# Patient Record
Sex: Female | Born: 1975 | Race: White | Hispanic: No | Marital: Married | Smoking: Never smoker
Health system: Southern US, Community
[De-identification: ages and names within clinical notes are randomized; demographics above are authoritative.]

## PROBLEM LIST (undated history)

## (undated) DIAGNOSIS — F419 Anxiety disorder, unspecified: Secondary | ICD-10-CM

## (undated) DIAGNOSIS — IMO0002 Reserved for concepts with insufficient information to code with codable children: Secondary | ICD-10-CM

## (undated) DIAGNOSIS — S42409A Unspecified fracture of lower end of unspecified humerus, initial encounter for closed fracture: Secondary | ICD-10-CM

## (undated) DIAGNOSIS — E669 Obesity, unspecified: Secondary | ICD-10-CM

## (undated) HISTORY — PX: OTHER SURGICAL HISTORY: SHX169

## (undated) HISTORY — DX: Anxiety disorder, unspecified: F41.9

## (undated) HISTORY — DX: Reserved for concepts with insufficient information to code with codable children: IMO0002

## (undated) HISTORY — DX: Unspecified fracture of lower end of unspecified humerus, initial encounter for closed fracture: S42.409A

## (undated) HISTORY — DX: Obesity, unspecified: E66.9

## (undated) HISTORY — PX: EYE SURGERY: SHX253

---

## 2012-08-14 ENCOUNTER — Encounter: Payer: Self-pay | Admitting: Emergency Medicine

## 2012-08-14 ENCOUNTER — Emergency Department (INDEPENDENT_AMBULATORY_CARE_PROVIDER_SITE_OTHER)
Admission: EM | Admit: 2012-08-14 | Discharge: 2012-08-14 | Disposition: A | Discharge status: Home / Self Care | Payer: BC Managed Care – PPO | Source: Home / Self Care | Attending: Family Medicine | Admitting: Family Medicine

## 2012-08-14 DIAGNOSIS — Z711 Person with feared health complaint in whom no diagnosis is made: Secondary | ICD-10-CM

## 2012-08-14 NOTE — ED Notes (Signed)
STD testing, Patient is asymptomatic, but may have had possible exposure due to husband's affair

## 2012-08-14 NOTE — ED Provider Notes (Signed)
History     CSN: [id number]  Arrival date & time 08/14/12  0905   First MD Initiated Contact with Patient 08/14/12 1013      Chief Complaint  Patient presents with  . std testing       HPI Comments: Patient presents for STD testing.  She is completely asymptomatic, but fears that she may have had possible exposure due to her husband's affair.  The history is provided by the patient.    History reviewed. No pertinent past medical history.  Past Surgical History  Procedure Date  . Tibial plateau repair     Family History  Problem Relation Age of Onset  . Alzheimer's disease Father     History  Substance Use Topics  . Smoking status: Never Smoker   . Smokeless tobacco: Not on file  . Alcohol Use: Yes    OB History    Grav Para Term Preterm Abortions TAB SAB Ect Mult Living                  Review of Systems  Constitutional: Negative.   Genitourinary: Negative.   All other systems reviewed and are negative.    Allergies  Review of patient's allergies indicates no known allergies.  Home Medications   Current Outpatient Rx  Name  Route  Sig  Dispense  Refill  . ALPRAZOLAM 0.25 MG PO TABS   Oral   Take 0.25 mg by mouth at bedtime as needed.         Marland Kitchen ESCITALOPRAM OXALATE 10 MG PO TABS   Oral   Take 10 mg by mouth daily.           BP 100/67  Pulse 78  Temp 97.6 F (36.4 C) (Oral)  Resp 16  Ht 5\' 6"  (1.676 m)  Wt 196 lb (88.905 kg)  BMI 31.64 kg/m2  SpO2 98%  Physical Exam Nursing notes and Vital Signs reviewed. Appearance:  Patient appears stated age, and in no acute distress. Patient is obese (BMI 31.6) Eyes:  Pupils are equal, round, and reactive to light and accomodation.  Extraocular movement is intact.  Conjunctivae are not inflamed  Pharynx:  Normal Neck:  Supple.  No adenopathy Lungs:  Clear to auscultation.  Breath sounds are equal.  Heart:  Regular rate and rhythm without murmurs, rubs, or gallops.  Abdomen:  Nontender without  masses or hepatosplenomegaly.  Bowel sounds are present.  No CVA or flank tenderness.  Extremities:  No edema.  No calf tenderness Skin:  No rash present.  Pelvic exam deferred  ED Course  Procedures none   Labs Reviewed  RPR   Narrative:    Performed at:  [facility]                [address], [address]                [place], [place] [address]  HSV(HERPES SMPLX)ABS-I+II(IGG+IGM)-BLD   Narrative:    Performed at:  [facility]                [address], [address]                [place], [place] [address]  HIV ANTIBODY (ROUTINE TESTING)   Narrative:    Performed at:  [facility]                [address]  [place], [place] [address]  GC/CHLAMYDIA PROBE AMP, URINE   Narrative:    Performed at:  [facility]                [address]                [place], [place] [address]      1. Concern about STD in female without diagnosis       MDM  Pending GC/chlamydia, RPR, HSV 1 & 2, HIV.        Lattie Haw, MD 08/18/12 2012

## 2012-08-15 LAB — HIV ANTIBODY (ROUTINE TESTING W REFLEX): HIV: NONREACTIVE

## 2012-08-15 LAB — RPR

## 2012-08-21 ENCOUNTER — Telehealth: Payer: Self-pay | Admitting: *Deleted

## 2012-11-06 ENCOUNTER — Ambulatory Visit (INDEPENDENT_AMBULATORY_CARE_PROVIDER_SITE_OTHER): Payer: BC Managed Care – PPO

## 2012-11-06 ENCOUNTER — Encounter: Payer: Self-pay | Admitting: Physician Assistant

## 2012-11-06 ENCOUNTER — Ambulatory Visit (INDEPENDENT_AMBULATORY_CARE_PROVIDER_SITE_OTHER): Payer: BC Managed Care – PPO | Admitting: Physician Assistant

## 2012-11-06 VITALS — BP 105/78 | HR 69 | Ht 67.0 in

## 2012-11-06 DIAGNOSIS — F341 Dysthymic disorder: Secondary | ICD-10-CM

## 2012-11-06 DIAGNOSIS — F4323 Adjustment disorder with mixed anxiety and depressed mood: Secondary | ICD-10-CM

## 2012-11-06 DIAGNOSIS — M238X2 Other internal derangements of left knee: Secondary | ICD-10-CM

## 2012-11-06 DIAGNOSIS — S82202A Unspecified fracture of shaft of left tibia, initial encounter for closed fracture: Secondary | ICD-10-CM | POA: Insufficient documentation

## 2012-11-06 DIAGNOSIS — Z9889 Other specified postprocedural states: Secondary | ICD-10-CM

## 2012-11-06 DIAGNOSIS — R29898 Other symptoms and signs involving the musculoskeletal system: Secondary | ICD-10-CM

## 2012-11-06 DIAGNOSIS — S8992XA Unspecified injury of left lower leg, initial encounter: Secondary | ICD-10-CM

## 2012-11-06 DIAGNOSIS — F329 Major depressive disorder, single episode, unspecified: Secondary | ICD-10-CM

## 2012-11-06 DIAGNOSIS — F419 Anxiety disorder, unspecified: Secondary | ICD-10-CM

## 2012-11-06 DIAGNOSIS — W19XXXA Unspecified fall, initial encounter: Secondary | ICD-10-CM

## 2012-11-06 DIAGNOSIS — S99929A Unspecified injury of unspecified foot, initial encounter: Secondary | ICD-10-CM

## 2012-11-06 MED ORDER — VENLAFAXINE HCL ER 37.5 MG PO CP24: 37.5000 mg | ORAL_CAPSULE | Freq: Every day | ORAL | Status: DC

## 2012-11-06 MED ORDER — CLONAZEPAM 0.5 MG PO TABS: 0.5000 mg | ORAL_TABLET | Freq: Two times a day (BID) | ORAL | Status: DC | PRN

## 2012-11-06 NOTE — Progress Notes (Signed)
  Subjective:    Patient ID: Patricia Robertson, female    DOB: 1975/10/05, 37 y.o.   MRN: [id number]  HPI Patient is a 37 yo female who presents to the clinic to establish care. PMH is positive for a fracture to her tibal plateau, anxiety and depression. Family hx positive for diabetes/alzheimers. Never smoked. Social drinker.   Anxiety/Depression- uncontrolled pt has been on lexapro for 5 years and has worked well. Pt going through divorce and moved to [place] back from [place]. She is having occasional panic attacks where she does not know what to do. She feels very out of control with her finances/marrriage/her daughters happiness. She denies any feelings of suicide or harming others. She does not like how xanax makes her feel. She wants something that does not knock her out. Has appt with priest but things have been busy with easter.   She is also concerned because about 1 week ago she reinjured her left knee and felt a crackling piece of something in her knee cap. She had reconstructive surgery in 2012 when her left tibal plataeu fractured. She does not feel the loose object today. There is no ongoing pain. Wants checked out today.     Review of Systems  All other systems reviewed and are negative.       Objective:   Physical Exam  Constitutional: She is oriented to person, place, and time. She appears well-developed and well-nourished.  HENT:  Head: Normocephalic and atraumatic.  Cardiovascular: Normal rate, regular rhythm and normal heart sounds.   Pulmonary/Chest: Effort normal and breath sounds normal. She has no wheezes.  Musculoskeletal:  Well -healed scar on the lateral and medial side of left knee. 2+ creptius. Strength 5/5. No joint tenderness. No extra bone or object felt in knee cap. ROM normal.   Neurological: She is alert and oriented to person, place, and time.  Skin: Skin is warm and dry.  Psychiatric: She has a normal mood and affect. Her behavior is normal.           Assessment & Plan:  Anxiety and depression- will taper off lexapro and start effexor. Pt aware of adverse side effects. Will recheck in 6 weeks. Stop xanax and start klonapin as needed. I think unbaised counselor would help with coping process. Pt would like faith based counseling and is working on that through her church.   Left knee creptius/new trauma- Will xray today. Reassured pt that I did not feel anything in knee cap. Will call with results.

## 2012-11-07 ENCOUNTER — Telehealth: Payer: Self-pay | Admitting: *Deleted

## 2012-11-07 NOTE — Telephone Encounter (Signed)
Pt calls and was seen yesterday and thought you were gonna send in Celebrex for her as you had discussed this but she did not the rx for it.

## 2012-11-07 NOTE — Telephone Encounter (Signed)
Opened in error

## 2012-11-07 NOTE — Telephone Encounter (Signed)
I do not remember that. If that is what she has been on I can continue. I am guessing this is for knee pain? I hope she did get effexor? I did send that. Confirm celebrex for knee pain and if that is what she has been on will send to pharmacy.

## 2012-11-08 ENCOUNTER — Other Ambulatory Visit: Payer: Self-pay | Admitting: Physician Assistant

## 2012-11-08 MED ORDER — CELECOXIB 100 MG PO CAPS: 100.0000 mg | ORAL_CAPSULE | Freq: Two times a day (BID) | ORAL | Status: DC

## 2012-11-08 NOTE — Progress Notes (Signed)
Sent!

## 2012-11-08 NOTE — Telephone Encounter (Signed)
Yes it is for the knee pain. Please send to pharmacy

## 2012-12-15 ENCOUNTER — Ambulatory Visit: Payer: BC Managed Care – PPO | Admitting: Physician Assistant

## 2012-12-20 ENCOUNTER — Encounter: Payer: Self-pay | Admitting: Physician Assistant

## 2012-12-20 ENCOUNTER — Ambulatory Visit (INDEPENDENT_AMBULATORY_CARE_PROVIDER_SITE_OTHER): Payer: BC Managed Care – PPO | Admitting: Physician Assistant

## 2012-12-20 VITALS — BP 126/76 | HR 79 | Wt 199.0 lb

## 2012-12-20 DIAGNOSIS — F419 Anxiety disorder, unspecified: Secondary | ICD-10-CM

## 2012-12-20 DIAGNOSIS — F341 Dysthymic disorder: Secondary | ICD-10-CM

## 2012-12-20 MED ORDER — VENLAFAXINE HCL ER 37.5 MG PO CP24: 37.5000 mg | ORAL_CAPSULE | Freq: Every day | ORAL | Status: DC

## 2012-12-20 MED ORDER — CETIRIZINE HCL 10 MG PO TABS: 10.0000 mg | ORAL_TABLET | Freq: Every day | ORAL | Status: DC

## 2012-12-20 NOTE — Progress Notes (Signed)
  Subjective:    Patient ID: Patricia Robertson, female    DOB: [birth year], 36 y.o.   MRN: [id number]  HPI Patient presents to the clinic for medication refill for Effexor and klonapin. Pt reports that she is feeling great. She is 100 percent improved with anxiety and depression. She feels like she has her head on straight and able to accomplish things. She denies any feelings of suicide or helplessness. She likes the current dose and does not want to make any changes. She occasionally uses klonapin but not everyday. It does help when she needs it the most. She takes effexor everyday.    Review of Systems     Objective:   Physical Exam  Constitutional: She appears well-developed and well-nourished.  HENT:  Head: Normocephalic and atraumatic.  Cardiovascular: Normal rate, regular rhythm and normal heart sounds.   Pulmonary/Chest: Effort normal and breath sounds normal. She has no wheezes.  Skin: Skin is warm and dry.  Psychiatric: She has a normal mood and affect. Her behavior is normal.          Assessment & Plan:  Anxiety and depression- Refilled Effexor. Does not need refill on klonapin. Follow up in 6 months.   Seasonal allergies- refilled zyrtec.   Pt aware need CPE.

## 2012-12-31 ENCOUNTER — Other Ambulatory Visit: Payer: Self-pay | Admitting: Physician Assistant

## 2013-03-09 ENCOUNTER — Encounter: Payer: Self-pay | Admitting: Physician Assistant

## 2013-03-09 ENCOUNTER — Ambulatory Visit (INDEPENDENT_AMBULATORY_CARE_PROVIDER_SITE_OTHER): Payer: BC Managed Care – PPO | Admitting: Physician Assistant

## 2013-03-09 VITALS — BP 129/71 | HR 90 | Ht 66.0 in | Wt 199.0 lb

## 2013-03-09 DIAGNOSIS — Z23 Encounter for immunization: Secondary | ICD-10-CM

## 2013-03-09 DIAGNOSIS — Z0000 Encounter for general adult medical examination without abnormal findings: Secondary | ICD-10-CM

## 2013-03-09 DIAGNOSIS — Z13220 Encounter for screening for lipoid disorders: Secondary | ICD-10-CM

## 2013-03-09 DIAGNOSIS — Z131 Encounter for screening for diabetes mellitus: Secondary | ICD-10-CM

## 2013-03-09 NOTE — Progress Notes (Signed)
  Subjective:     Patricia Robertson is a 37 y.o. female and is here for a comprehensive physical exam. The patient reports no problems. Patient would like to discuss birth control options.  History   Social History  . Marital Status: Legally Separated    Spouse Name: N/A    Number of Children: N/A  . Years of Education: N/A   Occupational History  . Not on file.   Social History Main Topics  . Smoking status: Never Smoker   . Smokeless tobacco: Not on file  . Alcohol Use: Yes  . Drug Use: Not on file  . Sexual Activity: Not on file   Other Topics Concern  . Not on file   Social History Narrative  . No narrative on file   Health Maintenance  Topic Date Due  . Tetanus/tdap  11/27/1994  . Influenza Vaccine  03/19/2013  . Pap Smear  03/10/2015    The following portions of the patient's history were reviewed and updated as appropriate: allergies, current medications, past family history, past medical history, past social history, past surgical history and problem list.  Review of Systems A comprehensive review of systems was negative.   Objective:    BP 129/71  Pulse 90  Ht 5\' 6"  (1.676 m)  Wt 199 lb (90.266 kg)  BMI 32.13 kg/m2  SpO2 97% General appearance: alert, cooperative and appears stated age Head: Normocephalic, without obvious abnormality, atraumatic Eyes: conjunctivae/corneas clear. PERRL, EOM's intact. Fundi benign. Ears: normal TM's and external ear canals both ears Nose: Nares normal. Septum midline. Mucosa normal. No drainage or sinus tenderness. Throat: lips, mucosa, and tongue normal; teeth and gums normal Neck: no adenopathy, no carotid bruit, no JVD, supple, symmetrical, trachea midline and thyroid not enlarged, symmetric, no tenderness/mass/nodules Back: symmetric, no curvature. ROM normal. No CVA tenderness. Lungs: clear to auscultation bilaterally Breasts: normal appearance, no masses or tenderness Heart: regular rate and rhythm, S1, S2 normal,  no murmur, click, rub or gallop Abdomen: soft, non-tender; bowel sounds normal; no masses,  no organomegaly Extremities: extremities normal, atraumatic, no cyanosis or edema Pulses: 2+ and symmetric Skin: Skin color, texture, turgor normal. No rashes or lesions Lymph nodes: Cervical, supraclavicular, and axillary nodes normal. Neurologic: Grossly normal    Assessment:    Healthy female exam.      Plan:     CPE-last Pap was 03/09/12. Patient was given Tdap today. All other vaccines up-to-date. Fasting cholesterol and CMP were ordered today. Handout was given on health maintenance items. Regular diet and exercise was discussed patient encouraged to take calcium 1200 mg and vitamin D 800 mg daily. Birth control options were discussed in detail NuvaRing and IUD are her favorite. We did not have a sample of the neighboring they gave informational packet on each choice. Patient is to call back if she would like referral for IUD insertion or call in [place]. Patient aware that this does not protect against STDs.  Anxiety and depression-patient is well controlled today she does not need a refill.  After Visit Summary for Counseling Recommendations

## 2013-03-09 NOTE — Patient Instructions (Addendum)
Intrauterine Device Information An intrauterine device (IUD) is inserted into your uterus and prevents pregnancy. There are 2 types of IUDs available:  Copper IUD. This type of IUD is wrapped in copper wire and is placed inside the uterus. Copper makes the uterus and fallopian tubes produce a fluid that kills sperm. The copper IUD can stay in place for 10 years.  Hormone IUD. This type of IUD contains the hormone progestin (synthetic progesterone). The hormone thickens the cervical mucus and prevents sperm from entering the uterus, and it also thins the uterine lining to prevent implantation of a fertilized egg. The hormone can weaken or kill the sperm that get into the uterus. The hormone IUD can stay in place for 5 years. Your caregiver will make sure you are a good candidate for a contraceptive IUD. Discuss with your caregiver the possible side effects. ADVANTAGES  It is highly effective, reversible, long-acting, and low maintenance.  There are no estrogen-related side effects.  An IUD can be used when breastfeeding.  It is not associated with weight gain.  It works immediately after insertion.  The copper IUD does not interfere with your female hormones.  The progesterone IUD can make heavy menstrual periods lighter.  The progesterone IUD can be used for 5 years.  The copper IUD can be used for 10 years. DISADVANTAGES  The progesterone IUD can be associated with irregular bleeding patterns.  The copper IUD can make your menstrual flow heavier and more painful.  You may experience cramping and vaginal bleeding after insertion. Document Released: 06/08/2004 Document Revised: 09/27/2011 Document Reviewed: 11/07/2010 [facility] Patient Information 2014 [place], [place]. Keeping You Healthy  Get These Tests 1. Blood Pressure- Have your blood pressure checked once a year by your health care provider.  Normal blood pressure is 120/80. 2. Weight- Have your body mass index (BMI)  calculated to screen for obesity.  BMI is measure of body fat based on height and weight.  You can also calculate your own BMI at https://www.west-esparza.com/. 3. Cholesterol- Have your cholesterol checked every 5 years starting at age 66 then yearly starting at age 56. 4. Chlamydia, HIV, and other sexually transmitted diseases- Get screened every year until age 51, then within three months of each new sexual provider. 5. Pap Smear- Every 1-3 years; discuss with your health care provider. 6. Mammogram- Every year starting at age 40  Take these medicines  Calcium with Vitamin D-Your body needs 1200 mg of Calcium each day and [phone] IU of Vitamin D daily.  Your body can only absorb 500 mg of Calcium at a time so Calcium must be taken in 2 or 3 divided doses throughout the day.  Multivitamin with folic acid- Once daily if it is possible for you to become pregnant.  Get these Immunizations  Gardasil-Series of three doses; prevents HPV related illness such as genital warts and cervical cancer.  Menactra-Single dose; prevents meningitis.  Tetanus shot- Every 10 years.  Flu shot-Every year.  Take these steps 1. Do not smoke-Your healthcare provider can help you quit.  For tips on how to quit go to www.smokefree.gov or call 1-800 QUITNOW. 2. Be physically active- Exercise 5 days a week for at least 30 minutes.  If you are not already physically active, start slow and gradually work up to 30 minutes of moderate physical activity.  Examples of moderate activity include walking briskly, dancing, swimming, bicycling, etc. 3. Breast Cancer- A self breast exam every month is important for early detection of breast  cancer.  For more information and instruction on self breast exams, ask your healthcare provider or [place].es. 4. Eat a healthy diet- Eat a variety of healthy foods such as fruits, vegetables, whole grains, low fat milk, low fat cheeses, yogurt, lean meats,  poultry and fish, beans, nuts, tofu, etc.  For more information go to www. Thenutritionsource.org 5. Drink alcohol in moderation- Limit alcohol intake to one drink or less per day. Never drink and drive. 6. Depression- Your emotional health is as important as your physical health.  If you're feeling down or losing interest in things you normally enjoy please talk to your healthcare provider about being screened for depression. 7. Dental visit- Brush and floss your teeth twice daily; visit your dentist twice a year. 8. Eye doctor- Get an eye exam at least every 2 years. 9. Helmet use- Always wear a helmet when riding a bicycle, motorcycle, rollerblading or skateboarding. 10. Safe sex- If you may be exposed to sexually transmitted infections, use a condom. 11. Seat belts- Seat belts can save your live; always wear one. 12. Smoke/Carbon Monoxide detectors- These detectors need to be installed on the appropriate level of your home. Replace batteries at least once a year. 13. Skin cancer- When out in the sun please cover up and use sunscreen 15 SPF or higher. 14. Violence- If anyone is threatening or hurting you, please tell your healthcare provider.

## 2013-05-04 ENCOUNTER — Telehealth: Payer: Self-pay | Admitting: Physician Assistant

## 2013-05-04 MED ORDER — AMBULATORY NON FORMULARY MEDICATION: Status: DC

## 2013-05-04 NOTE — Telephone Encounter (Signed)
Pt comes into office with daughter and reports headaches in neck and back of head. She is under a lot of stress. Decided to give rx for monthly massage. Continue to take aleve as needed for HA. She does not take celebrex daily only PRN.

## 2013-05-08 ENCOUNTER — Other Ambulatory Visit: Payer: Self-pay | Admitting: Physician Assistant

## 2013-05-10 ENCOUNTER — Telehealth: Payer: Self-pay | Admitting: *Deleted

## 2013-05-10 NOTE — Telephone Encounter (Signed)
If the symptoms just started I do not think effexor. You have been on for many months now. Sounds like a virus. If worsening or countinuing in the next week come in for visit.

## 2013-05-10 NOTE — Telephone Encounter (Signed)
Pt states that she is experiencing HA, muscle aches & diarrhea since starting the effexxor.  Could these be side effects or could this be the stress that she's under? Please advise

## 2013-05-11 NOTE — Telephone Encounter (Signed)
LMOM notifying pt. 

## 2013-05-25 ENCOUNTER — Encounter: Payer: Self-pay | Admitting: Family Medicine

## 2013-05-25 ENCOUNTER — Telehealth: Payer: Self-pay | Admitting: *Deleted

## 2013-05-25 ENCOUNTER — Ambulatory Visit (INDEPENDENT_AMBULATORY_CARE_PROVIDER_SITE_OTHER): Payer: BC Managed Care – PPO | Admitting: Family Medicine

## 2013-05-25 VITALS — BP 115/88 | HR 64 | Temp 97.4°F | Wt 197.0 lb

## 2013-05-25 DIAGNOSIS — G43901 Migraine, unspecified, not intractable, with status migrainosus: Secondary | ICD-10-CM

## 2013-05-25 DIAGNOSIS — M62838 Other muscle spasm: Secondary | ICD-10-CM

## 2013-05-25 DIAGNOSIS — G43909 Migraine, unspecified, not intractable, without status migrainosus: Secondary | ICD-10-CM

## 2013-05-25 MED ORDER — CYCLOBENZAPRINE HCL 10 MG PO TABS: 5.0000 mg | ORAL_TABLET | Freq: Every evening | ORAL | Status: DC | PRN

## 2013-05-25 MED ORDER — SUMATRIPTAN SUCCINATE 100 MG PO TABS: 100.0000 mg | ORAL_TABLET | ORAL | Status: DC | PRN

## 2013-05-25 MED ORDER — METHYLPREDNISOLONE SODIUM SUCC 125 MG IJ SOLR
125.0000 mg | Freq: Once | INTRAMUSCULAR | Status: AC
  Administered 2013-05-25: 125 mg via INTRAMUSCULAR

## 2013-05-25 MED ORDER — KETOROLAC TROMETHAMINE 60 MG/2ML IM SOLN
60.0000 mg | Freq: Once | INTRAMUSCULAR | Status: AC
  Administered 2013-05-25: 60 mg via INTRAMUSCULAR

## 2013-05-25 NOTE — Telephone Encounter (Signed)
Pt left message stating that she went home from work yesterday with a HA & still has it today.  She called a couple weeks ago with the same complaint.  Patricia Robertson seemed to think the HA was going along with other viral sx but pt is still complaining.  Needs appt? Please advise

## 2013-05-25 NOTE — Progress Notes (Signed)
Subjective:    Patient ID: Patricia Robertson, female    DOB: 1975-12-04, 37 y.o.   MRN: [id number]  HPI Has been having HA since mid- October.  Fist HA was 10/10 and lasted 3 days. Then about 2 weeks ago had another HA and lasted 2 days.  This HA started on 3 days ago and has been a little today . At the worse feels like on the top of the head and is bilaterall. Lots os pressue behind her eyes. Feels better to put pressure on her eye.  Having a lot of pain and pressure at the occiput.  Lots of shoulder and neck tension.  Has been nauseted as well. Has had loose bowels as well.  No vomiting.  Took celebrex - no relief.  Aleve - helped some.  Tried goodies and Advil too -helps some but keeps coming back. No vision changes.  Has not tried heat or ice on the neck.     Review of Systems BP 115/88  Pulse 64  Temp(Src) 97.4 F (36.3 C)  Wt 197 lb (89.359 kg)    No Known Allergies  No past medical history on file.  Past Surgical History  Procedure Laterality Date  . Tibial plateau repair      History   Social History  . Marital Status: Legally Separated    Spouse Name: N/A    Number of Children: N/A  . Years of Education: N/A   Occupational History  . Not on file.   Social History Main Topics  . Smoking status: Never Smoker   . Smokeless tobacco: Not on file  . Alcohol Use: Yes  . Drug Use: Not on file  . Sexual Activity: Not on file   Other Topics Concern  . Not on file   Social History Narrative  . No narrative on file    Family History  Problem Relation Age of Onset  . Alzheimer's disease Father   . Diabetes Mother   . Heart attack Maternal Grandfather   . Diabetes Maternal Grandfather     Outpatient Encounter Prescriptions as of 05/25/2013  Medication Sig  . celecoxib (CELEBREX) 100 MG capsule Take 100 mg by mouth as needed.  . [DISCONTINUED] celecoxib (CELEBREX) 100 MG capsule Take 1 capsule (100 mg total) by mouth 2 (two) times daily.  . AMBULATORY NON FORMULARY  MEDICATION Monthly therapeutic massage for tension headaches.  . cetirizine (ZYRTEC) 10 MG tablet Take 1 tablet (10 mg total) by mouth daily.  . clonazePAM (KLONOPIN) 0.5 MG tablet TAKE 1 TABLET BY MOUTH TWICE A DAY AS NEEDED FOR ANXIETY  . cyclobenzaprine (FLEXERIL) 10 MG tablet Take 0.5-1 tablets (5-10 mg total) by mouth at bedtime as needed for muscle spasms.  . SUMAtriptan (IMITREX) 100 MG tablet Take 1 tablet (100 mg total) by mouth every 2 (two) hours as needed for migraine or headache. May repeat in 2 hours if headache persists or recurs.  . venlafaxine XR (EFFEXOR-XR) 37.5 MG 24 hr capsule Take 1 capsule (37.5 mg total) by mouth daily.  . [EXPIRED] ketorolac (TORADOL) injection 60 mg   . [EXPIRED] methylPREDNISolone sodium succinate (SOLU-MEDROL) 125 mg/2 mL injection 125 mg           Objective:   Physical Exam  Constitutional: She is oriented to person, place, and time. She appears well-developed and well-nourished.  HENT:  Head: Normocephalic and atraumatic.  Right Ear: External ear normal.  Left Ear: External ear normal.  Nose: Nose normal.  Mouth/Throat: Oropharynx is  clear and moist.  TMs and canals are clear.   Eyes: Conjunctivae and EOM are normal. Pupils are equal, round, and reactive to light.  Neck: Neck supple. No thyromegaly present.  Cardiovascular: Normal rate, regular rhythm and normal heart sounds.   Pulmonary/Chest: Effort normal and breath sounds normal. She has no wheezes.  Lymphadenopathy:    She has no cervical adenopathy.  Neurological: She is alert and oriented to person, place, and time. She has normal reflexes. No cranial nerve deficit. She exhibits normal muscle tone.  Skin: Skin is warm and dry.  Psychiatric: She has a normal mood and affect. Her behavior is normal.          Assessment & Plan:  Migrainous, status - Tx with toradol and solumedrol IM. Go home and rest.  Work on neck tension with stretches and heating pad.  Will add muscle  relaxer at bedtime prn.  Monitor for triggers like foods etc.  Refer to PT for neck and shoulder tension.  F/U in 1 mo wth [name].  Given rx for miitex to use for rescue as well.   Neck and shoulder tension- refer for PT.  Says her insurance won't cover massage therapy.   She asked for 90 day rx for her clonazepam but she is not for refill yet.

## 2013-05-25 NOTE — Telephone Encounter (Signed)
Needs appt

## 2013-05-25 NOTE — Telephone Encounter (Signed)
Pt notified & was transferred to scheduling. 

## 2013-06-11 ENCOUNTER — Ambulatory Visit: Payer: BC Managed Care – PPO | Admitting: Physical Therapy

## 2013-06-11 DIAGNOSIS — M542 Cervicalgia: Secondary | ICD-10-CM

## 2013-06-11 DIAGNOSIS — G43901 Migraine, unspecified, not intractable, with status migrainosus: Secondary | ICD-10-CM

## 2013-06-11 DIAGNOSIS — M62838 Other muscle spasm: Secondary | ICD-10-CM

## 2013-06-11 DIAGNOSIS — M2560 Stiffness of unspecified joint, not elsewhere classified: Secondary | ICD-10-CM

## 2013-06-12 ENCOUNTER — Other Ambulatory Visit: Payer: Self-pay | Admitting: Family Medicine

## 2013-06-18 ENCOUNTER — Encounter: Payer: BC Managed Care – PPO | Admitting: Physical Therapy

## 2013-06-18 DIAGNOSIS — M2560 Stiffness of unspecified joint, not elsewhere classified: Secondary | ICD-10-CM

## 2013-06-18 DIAGNOSIS — G43901 Migraine, unspecified, not intractable, with status migrainosus: Secondary | ICD-10-CM

## 2013-06-18 DIAGNOSIS — M542 Cervicalgia: Secondary | ICD-10-CM

## 2013-06-18 DIAGNOSIS — M62838 Other muscle spasm: Secondary | ICD-10-CM

## 2013-06-19 ENCOUNTER — Encounter: Payer: BC Managed Care – PPO | Admitting: Physical Therapy

## 2013-06-22 ENCOUNTER — Encounter: Payer: BC Managed Care – PPO | Admitting: Physical Therapy

## 2013-06-22 ENCOUNTER — Ambulatory Visit (INDEPENDENT_AMBULATORY_CARE_PROVIDER_SITE_OTHER): Payer: BC Managed Care – PPO | Admitting: Physician Assistant

## 2013-06-22 ENCOUNTER — Ambulatory Visit: Payer: BC Managed Care – PPO | Admitting: Family Medicine

## 2013-06-22 ENCOUNTER — Encounter: Payer: Self-pay | Admitting: Physician Assistant

## 2013-06-22 VITALS — BP 117/75 | HR 62 | Wt 195.0 lb

## 2013-06-22 DIAGNOSIS — M62838 Other muscle spasm: Secondary | ICD-10-CM

## 2013-06-22 DIAGNOSIS — M542 Cervicalgia: Secondary | ICD-10-CM

## 2013-06-22 DIAGNOSIS — G43901 Migraine, unspecified, not intractable, with status migrainosus: Secondary | ICD-10-CM

## 2013-06-22 DIAGNOSIS — G43909 Migraine, unspecified, not intractable, without status migrainosus: Secondary | ICD-10-CM

## 2013-06-22 DIAGNOSIS — F341 Dysthymic disorder: Secondary | ICD-10-CM

## 2013-06-22 DIAGNOSIS — M2560 Stiffness of unspecified joint, not elsewhere classified: Secondary | ICD-10-CM

## 2013-06-22 DIAGNOSIS — F329 Major depressive disorder, single episode, unspecified: Secondary | ICD-10-CM

## 2013-06-22 MED ORDER — CLONAZEPAM 0.5 MG PO TABS: 0.5000 mg | ORAL_TABLET | Freq: Two times a day (BID) | ORAL | Status: DC | PRN

## 2013-06-22 MED ORDER — SUMATRIPTAN SUCCINATE 100 MG PO TABS: 100.0000 mg | ORAL_TABLET | ORAL | Status: DC | PRN

## 2013-06-22 MED ORDER — CYCLOBENZAPRINE HCL 10 MG PO TABS: 10.0000 mg | ORAL_TABLET | Freq: Every day | ORAL | Status: DC

## 2013-06-22 MED ORDER — VENLAFAXINE HCL ER 37.5 MG PO CP24: 37.5000 mg | ORAL_CAPSULE | Freq: Every day | ORAL | Status: DC

## 2013-06-23 NOTE — Progress Notes (Signed)
   Subjective:    Patient ID: Patricia Robertson, female    DOB: 07-Apr-1976, 37 y.o.   MRN: [id number]  HPI Patient returns to clinic for medication refill for migraines/neck muscle spasms and anxiety/depression.   Migraines have been very much so controlled going to PT, using flexeril at bedtime and taking imitrex as needed for rescue. She has only had 2-3 headaches since and they have been easily treated.   Anxiety and depression is very much so controlled. Pt feels great. She would even like to consider coming off in the near future. It helped with the transition from divorce but she feels much more stable now.    Review of Systems     Objective:   Physical Exam  Constitutional: She is oriented to person, place, and time. She appears well-developed and well-nourished.  HENT:  Head: Normocephalic and atraumatic.  Cardiovascular: Normal rate, regular rhythm and normal heart sounds.   Pulmonary/Chest: Effort normal and breath sounds normal.  Neurological: She is alert and oriented to person, place, and time.  Skin: Skin is warm and dry.  Psychiatric: She has a normal mood and affect. Her behavior is normal.          Assessment & Plan:  Migraine/neck muscle spasms- Pt is doing much better. Encouraged pt to continue with PT. Refilled Imitrex if needed for relief. Refilled flexeril to continue taking at bedtime if helping prevent migraines. Follow up if needed or in 6 months.   Anxiety/depression- Refilled effexor for 6 months. We can discuss taper at that time if pt is still wanting too.

## 2013-06-26 ENCOUNTER — Encounter: Payer: BC Managed Care – PPO | Admitting: Physical Therapy

## 2013-06-26 DIAGNOSIS — M62838 Other muscle spasm: Secondary | ICD-10-CM

## 2013-06-26 DIAGNOSIS — M2560 Stiffness of unspecified joint, not elsewhere classified: Secondary | ICD-10-CM

## 2013-06-26 DIAGNOSIS — G43901 Migraine, unspecified, not intractable, with status migrainosus: Secondary | ICD-10-CM

## 2013-06-26 DIAGNOSIS — M542 Cervicalgia: Secondary | ICD-10-CM

## 2013-07-02 ENCOUNTER — Encounter (INDEPENDENT_AMBULATORY_CARE_PROVIDER_SITE_OTHER): Payer: BC Managed Care – PPO | Admitting: Physical Therapy

## 2013-07-02 DIAGNOSIS — M62838 Other muscle spasm: Secondary | ICD-10-CM

## 2013-07-02 DIAGNOSIS — M542 Cervicalgia: Secondary | ICD-10-CM

## 2013-07-02 DIAGNOSIS — G43901 Migraine, unspecified, not intractable, with status migrainosus: Secondary | ICD-10-CM

## 2013-07-02 DIAGNOSIS — M2560 Stiffness of unspecified joint, not elsewhere classified: Secondary | ICD-10-CM

## 2013-07-05 ENCOUNTER — Encounter: Payer: BC Managed Care – PPO | Admitting: Physical Therapy

## 2013-07-05 ENCOUNTER — Encounter (INDEPENDENT_AMBULATORY_CARE_PROVIDER_SITE_OTHER): Payer: BC Managed Care – PPO | Admitting: Physical Therapy

## 2013-07-05 DIAGNOSIS — M2560 Stiffness of unspecified joint, not elsewhere classified: Secondary | ICD-10-CM

## 2013-07-05 DIAGNOSIS — M62838 Other muscle spasm: Secondary | ICD-10-CM

## 2013-07-05 DIAGNOSIS — G43901 Migraine, unspecified, not intractable, with status migrainosus: Secondary | ICD-10-CM

## 2013-07-05 DIAGNOSIS — M542 Cervicalgia: Secondary | ICD-10-CM

## 2013-07-06 ENCOUNTER — Encounter: Payer: BC Managed Care – PPO | Admitting: Physical Therapy

## 2013-07-20 ENCOUNTER — Other Ambulatory Visit: Payer: Self-pay | Admitting: Physician Assistant

## 2013-08-29 ENCOUNTER — Telehealth: Payer: Self-pay | Admitting: *Deleted

## 2013-08-29 ENCOUNTER — Other Ambulatory Visit: Payer: Self-pay | Admitting: Physician Assistant

## 2013-08-29 MED ORDER — PSEUDOEPHEDRINE HCL 60 MG PO TABS: 60.0000 mg | ORAL_TABLET | ORAL | Status: DC | PRN

## 2013-08-29 NOTE — Telephone Encounter (Signed)
Pt left vm stating that she is fighting a head cold & wanted to know if you could send rx sudafed for her so she could use her flex spending acct.  [facility] [address].

## 2013-08-29 NOTE — Telephone Encounter (Signed)
Sent!

## 2013-08-30 NOTE — Telephone Encounter (Signed)
Left detailed message on vm.

## 2013-12-19 ENCOUNTER — Encounter: Payer: Self-pay | Admitting: Physician Assistant

## 2013-12-19 ENCOUNTER — Ambulatory Visit (INDEPENDENT_AMBULATORY_CARE_PROVIDER_SITE_OTHER): Payer: BC Managed Care – PPO | Admitting: Physician Assistant

## 2013-12-19 VITALS — BP 119/76 | HR 68 | Ht 66.0 in | Wt 195.0 lb

## 2013-12-19 DIAGNOSIS — F329 Major depressive disorder, single episode, unspecified: Secondary | ICD-10-CM

## 2013-12-19 DIAGNOSIS — F341 Dysthymic disorder: Secondary | ICD-10-CM

## 2013-12-19 DIAGNOSIS — F419 Anxiety disorder, unspecified: Principal | ICD-10-CM

## 2013-12-19 DIAGNOSIS — Z13220 Encounter for screening for lipoid disorders: Secondary | ICD-10-CM

## 2013-12-19 DIAGNOSIS — Z131 Encounter for screening for diabetes mellitus: Secondary | ICD-10-CM

## 2013-12-19 MED ORDER — ESCITALOPRAM OXALATE 10 MG PO TABS: 10.0000 mg | ORAL_TABLET | Freq: Every day | ORAL | Status: DC

## 2013-12-19 NOTE — Progress Notes (Signed)
   Subjective:    Patient ID: Patricia Robertson, female    DOB: October 13, 1975, 38 y.o.   MRN: [id number]  HPI Pt presents to the clinic to discuss medication. She has been on effexor for a little over a year and doesn't like the way it is making her feel. Every day she takes it and it makes her feel "shocky" and "jittery". She was controlled on lexapro for many years and would like to try again. She is at a much better place currently. She is happy. She denies any suicidal or homicidal thoughts.   Review of Systems  All other systems reviewed and are negative.      Objective:   Physical Exam  Constitutional: She is oriented to person, place, and time. She appears well-developed and well-nourished.  HENT:  Head: Normocephalic and atraumatic.  Cardiovascular: Normal rate, regular rhythm and normal heart sounds.   Pulmonary/Chest: Effort normal and breath sounds normal.  Neurological: She is alert and oriented to person, place, and time.  Skin: Skin is dry.  Psychiatric: She has a normal mood and affect. Her behavior is normal.          Assessment & Plan:  Anxiety and depression- Will switch back to lexaprol 10mg . Stop effexor. Discussed side effects of stopping effexor and transitioning. Pt aware. May take some time to normalize. Follow up in 3 months for CPE. Any worsening depression please call office.   Screening labs ordered. Needs CPe.

## 2013-12-21 ENCOUNTER — Ambulatory Visit: Payer: BC Managed Care – PPO | Admitting: Sports Medicine

## 2013-12-28 LAB — COMPLETE METABOLIC PANEL WITH GFR
ALBUMIN: 4.1 g/dL (ref 3.5–5.2)
ALK PHOS: 59 U/L (ref 39–117)
ALT: 15 U/L (ref 0–35)
AST: 18 U/L (ref 0–37)
BILIRUBIN TOTAL: 0.6 mg/dL (ref 0.2–1.2)
BUN: 13 mg/dL (ref 6–23)
CO2: 24 mEq/L (ref 19–32)
Calcium: 9.1 mg/dL (ref 8.4–10.5)
Chloride: 107 mEq/L (ref 96–112)
Creat: 0.65 mg/dL (ref 0.50–1.10)
GFR, Est African American: >89 mL/min
GLUCOSE: 106 mg/dL — AB (ref 70–99)
POTASSIUM: 4.2 meq/L (ref 3.5–5.3)
SODIUM: 140 meq/L (ref 135–145)
TOTAL PROTEIN: 6.5 g/dL (ref 6.0–8.3)

## 2013-12-28 LAB — LIPID PANEL
CHOL/HDL RATIO: 3.6 ratio
Cholesterol: 170 mg/dL (ref 0–200)
HDL: 47 mg/dL (ref >39)
LDL CALC: 110 mg/dL — AB (ref 0–99)
Triglycerides: 64 mg/dL (ref <150)
VLDL: 13 mg/dL (ref 0–40)

## 2014-01-18 ENCOUNTER — Other Ambulatory Visit: Payer: Self-pay | Admitting: Family Medicine

## 2014-01-21 ENCOUNTER — Other Ambulatory Visit: Payer: Self-pay | Admitting: *Deleted

## 2014-01-21 NOTE — Telephone Encounter (Signed)
effexor was d/c'd at last office visit. Pharmacy was notified. Corliss SkainsJamie Skyrah Krupp, CMA

## 2014-01-21 NOTE — Telephone Encounter (Signed)
Refill for effexor d/c'd at last appt. Corliss SkainsJamie Lindberg Zenon, CMA

## 2014-02-22 ENCOUNTER — Telehealth: Payer: Self-pay | Admitting: *Deleted

## 2014-02-22 NOTE — Telephone Encounter (Signed)
Yes celebrex is a stronger but gentler version of ibuprofen. Don't use together. Your orthopedic should have given you norco/vicodin for pain. If not you can certainly combine celebrex with tylenol.

## 2014-02-22 NOTE — Telephone Encounter (Signed)
Pt notified.  She is following up with Dr. Karie Schwalbe on the 17th for her elbow.  She is wanting a referral for IUD placement..preferrably women's here in the building.  She was wondering which one you had recommended for her when you guys talked about them last summer, copper or mirena?

## 2014-02-22 NOTE — Telephone Encounter (Signed)
Pt called & wanted you to know that [place]  She has broken her left radial head.  She is meeting with on orthopedic dr today to see if PT is needed.  She will be back in town sun aug 16 & wanted to know if you wanted her to f/u on that mon or tues.  Also she wanted to know if she is ok to mix ibuprofen with celebrex.  Please advise.

## 2014-02-25 ENCOUNTER — Other Ambulatory Visit: Payer: Self-pay | Admitting: Physician Assistant

## 2014-02-25 DIAGNOSIS — Z30430 Encounter for insertion of intrauterine contraceptive device: Secondary | ICD-10-CM

## 2014-02-25 NOTE — Telephone Encounter (Signed)
I love copper because no hormones and last 10 years. mirena is also a fine choice 5 years and does have hormones. You may not have cycle with mirena you will with copper. Will make referral.

## 2014-02-27 NOTE — Telephone Encounter (Signed)
LMOM for pt to return call. 

## 2014-03-04 ENCOUNTER — Encounter: Payer: Self-pay | Admitting: Sports Medicine

## 2014-03-04 ENCOUNTER — Ambulatory Visit (INDEPENDENT_AMBULATORY_CARE_PROVIDER_SITE_OTHER): Payer: BC Managed Care – PPO | Admitting: Sports Medicine

## 2014-03-04 ENCOUNTER — Ambulatory Visit (INDEPENDENT_AMBULATORY_CARE_PROVIDER_SITE_OTHER): Payer: BC Managed Care – PPO

## 2014-03-04 VITALS — BP 127/84 | HR 76 | Ht 66.0 in | Wt 202.0 lb

## 2014-03-04 DIAGNOSIS — M533 Sacrococcygeal disorders, not elsewhere classified: Secondary | ICD-10-CM

## 2014-03-04 DIAGNOSIS — M25529 Pain in unspecified elbow: Secondary | ICD-10-CM

## 2014-03-04 DIAGNOSIS — S52123A Displaced fracture of head of unspecified radius, initial encounter for closed fracture: Secondary | ICD-10-CM | POA: Diagnosis not present

## 2014-03-04 DIAGNOSIS — S52122A Displaced fracture of head of left radius, initial encounter for closed fracture: Secondary | ICD-10-CM | POA: Insufficient documentation

## 2014-03-04 DIAGNOSIS — M549 Dorsalgia, unspecified: Secondary | ICD-10-CM

## 2014-03-04 NOTE — Assessment & Plan Note (Addendum)
1.5 weeks out. Xrays. Continue sling for another week then work on ROM.  I billed a fracture code for this encounter, all subsequent visits will be post-op checks in the global period.

## 2014-03-04 NOTE — Assessment & Plan Note (Signed)
Jammed her SI joint when falling off a horse. Home rehabilitation given. Return in a month, SI joint injection if no better.

## 2014-03-04 NOTE — Progress Notes (Signed)
   Subjective:    I'm seeing this patient as a consultation for:  Patricia GawJade Breeback, PA-C  CC: Left elbow injury, back pain  HPI: This is a very pleasant 38 year old female, approximately 1.5 weeks ago she fell off a horse impacting her left elbow, she was seen by a orthopedist and was diagnosed with a radial head fracture, treated with a sling appropriately. Unfortunately she continues to have some pain that she localizes on the right side directly over her sacroiliac joint, pain is moderate, persistent at the SI joint and minimal and improving at the elbow.  Past medical history, Surgical history, Family history not pertinant except as noted below, Social history, Allergies, and medications have been entered into the medical record, reviewed, and no changes needed.   Review of Systems: No headache, visual changes, nausea, vomiting, diarrhea, constipation, dizziness, abdominal pain, skin rash, fevers, chills, night sweats, weight loss, swollen lymph nodes, body aches, joint swelling, muscle aches, chest pain, shortness of breath, mood changes, visual or auditory hallucinations.   Objective:   General: Well Developed, well nourished, and in no acute distress.  Neuro/Psych: Alert and oriented x3, extra-ocular muscles intact, able to move all 4 extremities, sensation grossly intact. Skin: Warm and dry, no rashes noted.  Respiratory: Not using accessory muscles, speaking in full sentences, trachea midline.  Cardiovascular: Pulses palpable, no extremity edema. Abdomen: Does not appear distended. Left Elbow: Unremarkable to inspection. Range of motion full pronation, supination, flexion, extension. Strength is full to all of the above directions Stable to varus, valgus stress. Negative moving valgus stress test. Minimally tender to palpation of the radial head with reduction of pain with pronation and supination passively. Ulnar nerve does not sublux. Negative cubital tunnel Tinel's. Right  Hip: ROM IR: 60 Deg, ER: 60 Deg, Flexion: 120 Deg, Extension: 100 Deg, Abduction: 45 Deg, Adduction: 45 Deg Strength IR: 5/5, ER: 5/5, Flexion: 5/5, Extension: 5/5, Abduction: 5/5, Adduction: 5/5 Pelvic alignment unremarkable to inspection and palpation. Standing hip rotation and gait without trendelenburg / unsteadiness. Greater trochanter without tenderness to palpation. No tenderness over piriformis. SI joint is tender to palpation.  Elbow x-ray showed nondisplaced radial head fracture, extra-articular, sacral and lumbar x-rays are unremarkable. . Impression and Recommendations:   This case required medical decision making of moderate complexity.

## 2014-03-05 NOTE — Telephone Encounter (Signed)
Pt given all options in office yesterday.

## 2014-03-06 ENCOUNTER — Telehealth: Payer: Self-pay

## 2014-03-06 NOTE — Telephone Encounter (Signed)
Left message for patient to call office to schedule appointment. Received referral from primary care.

## 2014-03-20 ENCOUNTER — Telehealth: Payer: Self-pay | Admitting: *Deleted

## 2014-03-20 DIAGNOSIS — Z0000 Encounter for general adult medical examination without abnormal findings: Secondary | ICD-10-CM

## 2014-03-20 NOTE — Telephone Encounter (Signed)
Labs printed for upcoming physical.

## 2014-03-26 ENCOUNTER — Ambulatory Visit (INDEPENDENT_AMBULATORY_CARE_PROVIDER_SITE_OTHER): Payer: BC Managed Care – PPO | Admitting: Sports Medicine

## 2014-03-26 ENCOUNTER — Encounter: Payer: Self-pay | Admitting: Sports Medicine

## 2014-03-26 VITALS — BP 114/78 | HR 70 | Ht 66.0 in | Wt 199.0 lb

## 2014-03-26 DIAGNOSIS — M533 Sacrococcygeal disorders, not elsewhere classified: Secondary | ICD-10-CM | POA: Diagnosis not present

## 2014-03-26 DIAGNOSIS — S52122D Displaced fracture of head of left radius, subsequent encounter for closed fracture with routine healing: Secondary | ICD-10-CM

## 2014-03-26 DIAGNOSIS — S5290XD Unspecified fracture of unspecified forearm, subsequent encounter for closed fracture with routine healing: Secondary | ICD-10-CM

## 2014-03-26 NOTE — Assessment & Plan Note (Signed)
Completely resolved. 

## 2014-03-26 NOTE — Progress Notes (Signed)
Patient ID: Tyeisha Dinan, female   DOB: 1976/04/28, 38 y.o.   MRN: [id number]  Subjective:    CC: F/U of left radial head fracture and right-sided SI joint pain  HPI: Myrth Dahan is a very pleasant 38 year old female now 5.5 weeks s/p fall from a horse with subsequent nondisplaced left radial head fracture and right SI joint pain without abnormality on imaging of the coccyx, sacrum, and lumbar spine. She was placed in a right arm sling for 2.5 weeks and performed home rehabilitation exercises for several weeks. States that she is 85% improved, but does have continued weakness with forearm supination. She has discontinued the majority of her home rehabilitation exercises as she has improved, but continues to practice supination. Also reports that her SI joint pain has been completely resolved for several weeks. Has not needed anything for pain control.  Past medical history, Surgical history, Family history not pertinant except as noted below, Social history, Allergies, and medications have been entered into the medical record, reviewed, and no changes needed.   Review of Systems: No fevers, chills, night sweats, weight loss, chest pain, or shortness of breath.   Objective:    General: Well developed, well nourished, and in no acute distress.  Neuro: Alert and oriented x3, extra-ocular muscles intact, sensation grossly intact.  HEENT: Normocephalic, atraumatic, pupils equal round reactive to light, neck supple, no masses, no lymphadenopathy, thyroid nonpalpable.  Skin: Warm and dry, no rashes. Cardiac: Regular rate and rhythm, no murmurs rubs or gallops, no lower extremity edema.  Respiratory: Clear to auscultation bilaterally. Not using accessory muscles, speaking in full sentences.  Back Exam:  Inspection: Unremarkable  Motion: Flexion 45 deg, Extension 45 deg, Side Bending to 45 deg bilaterally,  Rotation to 45 deg bilaterally  SLR laying: Negative  XSLR laying: Negative  Palpable  tenderness: None. FABER: negative. Sensory change: Gross sensation intact to all lumbar and sacral dermatomes.  Reflexes: 2+ at both patellar tendons, 2+ at achilles tendons, Babinski's downgoing.  Strength at foot  Plantar-flexion: 5/5 Dorsi-flexion: 5/5 Eversion: 5/5 Inversion: 5/5  Leg strength  Quad: 5/5 Hamstring: 5/5 Hip flexor: 5/5 Hip abductors: 5/5  Gait unremarkable.  Left Elbow: Unremarkable to inspection. Range of motion full pronation, supination, flexion, extension. Strength is full to all of the above directions. Stable to varus, valgus stress. Negative moving valgus stress test. No discrete areas of tenderness to palpation. Ulnar nerve does not sublux. Negative cubital tunnel Tinel's.  Impression and Recommendations:   1. Left Radial Head Fracture: As the pain is 85% resolved with only subjective weakness with supination remaining, she may follow-up on an as needed basis.  2. SI Joint Pain: Pain has completely resolved, so she may follow-up on an as needed basis.

## 2014-03-26 NOTE — Assessment & Plan Note (Signed)
85% resolved, return as needed.

## 2014-05-21 ENCOUNTER — Telehealth: Payer: Self-pay | Admitting: *Deleted

## 2014-05-21 NOTE — Telephone Encounter (Signed)
Pt left vm this morning stating that she is having a dark brown discharge from her right nipple.  She is very concerned as this is new & never happened before.  Would you like for her to try to get on your schedule this week? Please advise.

## 2014-05-21 NOTE — Telephone Encounter (Signed)
You had an open acute slot at 11:15 tomorrow so she is scheduled then.  Thank you!

## 2014-05-21 NOTE — Telephone Encounter (Signed)
See if can put her on for tomorrow. Can double book if need to.

## 2014-05-22 ENCOUNTER — Encounter: Payer: Self-pay | Admitting: Family Medicine

## 2014-05-22 ENCOUNTER — Ambulatory Visit (INDEPENDENT_AMBULATORY_CARE_PROVIDER_SITE_OTHER): Payer: BC Managed Care – PPO | Admitting: Family Medicine

## 2014-05-22 VITALS — BP 122/88 | HR 76 | Ht 66.0 in | Wt 199.0 lb

## 2014-05-22 DIAGNOSIS — N6452 Nipple discharge: Secondary | ICD-10-CM

## 2014-05-22 LAB — POCT URINE PREGNANCY: PREG TEST UR: NEGATIVE

## 2014-05-22 NOTE — Progress Notes (Signed)
   Subjective:    Patient ID: Patricia Robertson, female    DOB: 12-10-1975, 38 y.o.   MRN: [id number]  HPI Noticed a brown discharge from the nipple on the right about a week ago.  Has noticed it  A couple of times.  No breast pain.  No fever, chills. Had a mammogram about 8 years ago for cyst.  No family hx of BrCa.    Review of Systems     Objective:   Physical Exam  Constitutional: She appears well-developed and well-nourished.  HENT:  Head: Normocephalic and atraumatic.  Pulmonary/Chest: Chest wall is not dull to percussion. She exhibits no mass, no tenderness, no bony tenderness and no laceration. Right breast exhibits no inverted nipple, no mass, no nipple discharge, no skin change and no tenderness. Breasts are symmetrical.  Lymphadenopathy:  No swollen LN in the axilla.   Skin: Skin is warm and dry. No rash noted. No erythema. No pallor.          Assessment & Plan:  Breast discharge - We'll get ultrasound and diagnostic mammogram for further evaluation. No family history of breast cancer no personal history of breast problems. Did encourage her to avoid irritation manipulation of the nipple. Make sure wearing a good supportive bra that does not allow for excess movement or friction the brown discoloration may actually be a little bit of old blood. There may be a blood vessel that has been injured. There was no sign of infection on exam today. Try to get patient is much reassurance as possible that this is likely benign and physiologic.

## 2014-05-29 ENCOUNTER — Encounter: Payer: Self-pay | Admitting: Family Medicine

## 2014-06-12 ENCOUNTER — Encounter: Payer: BC Managed Care – PPO | Admitting: Obstetrics & Gynecology

## 2014-06-27 ENCOUNTER — Encounter: Payer: Self-pay | Admitting: Obstetrics & Gynecology

## 2014-06-27 ENCOUNTER — Ambulatory Visit (INDEPENDENT_AMBULATORY_CARE_PROVIDER_SITE_OTHER): Payer: BC Managed Care – PPO | Admitting: Obstetrics & Gynecology

## 2014-06-27 VITALS — BP 125/79 | HR 69 | Resp 16 | Ht 66.25 in | Wt 200.0 lb

## 2014-06-27 DIAGNOSIS — E669 Obesity, unspecified: Secondary | ICD-10-CM

## 2014-06-27 DIAGNOSIS — Z01812 Encounter for preprocedural laboratory examination: Secondary | ICD-10-CM

## 2014-06-27 DIAGNOSIS — Z30014 Encounter for initial prescription of intrauterine contraceptive device: Secondary | ICD-10-CM

## 2014-06-27 MED ORDER — LEVONORGESTREL 20 MCG/24HR IU IUD
1.0000 | INTRAUTERINE_SYSTEM | Freq: Once | INTRAUTERINE | Status: AC
  Administered 2014-06-27: 1 via INTRAUTERINE

## 2014-06-27 NOTE — Progress Notes (Signed)
   Subjective:    Patient ID: Candiss Norseamara Vink, female    DOB: [birth year], 38 y.o.   MRN: [id number]  HPI 38 yo DW P1 55(38 yo daughter) here today to discuss contraception. She has a new partner, she plans that this will be monogamous. She is using condoms. She is interested in the [place]. She reports painful and possibly irregular periods.    Review of Systems  She uses a Diva cup for menstrual collection.     Objective:   Physical Exam  UPT negative, consent signed, Time out procedure done. Cervix prepped with betadine and grasped with a single tooth tenaculum. Mirena was easily placed and the strings were cut to 3-4 cm. Uterus sounded to 9 cm. She tolerated the procedure well.         Assessment & Plan:  HBA1C today Bariatric referral RTC 4 weeks for string check

## 2014-06-27 NOTE — Addendum Note (Signed)
Addended by: Arne ClevelandHUTCHINSON, MANDY J on: 06/27/2014 04:17 PM   Modules accepted: Orders

## 2014-06-28 LAB — HEMOGLOBIN A1C
Hgb A1c MFr Bld: 5.6 % (ref <5.7)
Mean Plasma Glucose: 114 mg/dL (ref <117)

## 2014-07-01 ENCOUNTER — Encounter: Payer: Self-pay | Admitting: Physician Assistant

## 2014-07-01 ENCOUNTER — Ambulatory Visit (INDEPENDENT_AMBULATORY_CARE_PROVIDER_SITE_OTHER): Payer: BC Managed Care – PPO | Admitting: Physician Assistant

## 2014-07-01 VITALS — BP 122/71 | HR 97 | Ht 66.0 in | Wt 199.0 lb

## 2014-07-01 DIAGNOSIS — T8389XA Other specified complication of genitourinary prosthetic devices, implants and grafts, initial encounter: Secondary | ICD-10-CM | POA: Diagnosis not present

## 2014-07-01 DIAGNOSIS — Z30432 Encounter for removal of intrauterine contraceptive device: Secondary | ICD-10-CM

## 2014-07-01 DIAGNOSIS — N939 Abnormal uterine and vaginal bleeding, unspecified: Secondary | ICD-10-CM

## 2014-07-01 NOTE — Patient Instructions (Signed)
Go to ER with any pain, bleeding more than 1 pad an hour, changing symptoms. Follow up with [facility] on Wednesday.

## 2014-07-01 NOTE — Progress Notes (Signed)
   Subjective:    Patient ID: Patricia Robertson, female    DOB: [birth year], 38 y.o.   MRN: [id number]  HPI  Pt presents to the clinic with IUd placed 06/27/14 by Dr. Marice Potterove. She reports some pelvic discomfort over the weekend with increased bleeding. She reports she can follow the strings and feel IUd outside the cervix. She is concerned today. [facility] is closed for renovations. She is going through 1 pad every 2-3 hours. More than her regular period. No fever, chills, n/v/d.     Review of Systems  All other systems reviewed and are negative.      Objective:   Physical Exam  Abdominal: Soft. Bowel sounds are normal. She exhibits no distension and no mass. There is no tenderness. There is no rebound and no guarding.  Genitourinary:  significant amount of blood outside cervical os. Strings and IUD partially outside cervix. I saw one side of IUD still remaining in cervical os. No outside source of bleeding.           Assessment & Plan:  Pelvic discomfort/IUD removal/vaginal bleeding- removed IUD today without complication. Gauzed used to soak up remaining blood. No bleeding source found in vaginal area likely from os. Follow up with [facility] on Wednesday. Discussed if bleeding increased to more than 1 pad every hour or developed pain go to ER.

## 2014-07-03 ENCOUNTER — Ambulatory Visit (INDEPENDENT_AMBULATORY_CARE_PROVIDER_SITE_OTHER): Payer: BC Managed Care – PPO | Admitting: Obstetrics & Gynecology

## 2014-07-03 ENCOUNTER — Encounter: Payer: Self-pay | Admitting: Obstetrics & Gynecology

## 2014-07-03 VITALS — BP 129/66 | HR 64 | Resp 16 | Ht 66.25 in | Wt 198.0 lb

## 2014-07-03 DIAGNOSIS — Z01812 Encounter for preprocedural laboratory examination: Secondary | ICD-10-CM

## 2014-07-03 DIAGNOSIS — Z30430 Encounter for insertion of intrauterine contraceptive device: Secondary | ICD-10-CM

## 2014-07-03 LAB — POCT URINE PREGNANCY: Preg Test, Ur: NEGATIVE

## 2014-07-03 MED ORDER — LEVONORGESTREL 20 MCG/24HR IU IUD
INTRAUTERINE_SYSTEM | Freq: Once | INTRAUTERINE | Status: AC
  Administered 2014-07-03: 10:00:00 via INTRAUTERINE

## 2014-07-03 NOTE — Progress Notes (Signed)
IUD came out.  ? Due to Diva cup.  Pt to abstain from diva cup for a few months. IUD became dislodged and [name] removed in her office.  IUD Procedure Note Patient identified, informed consent performed.  Discussed risks of irregular bleeding, cramping, infection, malpositioning or misplacement of the IUD outside the uterus which may require further procedures. Time out was performed.  Urine pregnancy test negative.  Speculum placed in the vagina.  Cervix visualized.  Cleaned with Betadine x 2.  Grasped anteriorly with a single tooth tenaculum.  Uterus sounded to 8 cm.  Mirena IUD placed per manufacturer's recommendations.  Strings trimmed to 3 cm. Tenaculum was removed, good hemostasis noted.  Patient tolerated procedure well.   Patient was given post-procedure instructions and the Mirena care card with expiration date.  Patient was also asked to check IUD strings periodically and follow up in 4-6 weeks for IUD check.

## 2014-07-04 LAB — GC/CHLAMYDIA PROBE AMP
CT PROBE, AMP APTIMA: NEGATIVE
GC Probe RNA: NEGATIVE

## 2014-07-05 ENCOUNTER — Ambulatory Visit: Payer: BC Managed Care – PPO | Admitting: Physician Assistant

## 2014-07-31 ENCOUNTER — Ambulatory Visit: Payer: BC Managed Care – PPO | Admitting: Obstetrics & Gynecology

## 2014-08-05 ENCOUNTER — Encounter: Payer: Self-pay | Admitting: Obstetrics & Gynecology

## 2014-08-05 ENCOUNTER — Ambulatory Visit (INDEPENDENT_AMBULATORY_CARE_PROVIDER_SITE_OTHER): Payer: BC Managed Care – PPO | Admitting: Obstetrics & Gynecology

## 2014-08-05 VITALS — BP 120/70 | HR 81 | Resp 16 | Ht 66.0 in | Wt 202.0 lb

## 2014-08-05 DIAGNOSIS — Z01419 Encounter for gynecological examination (general) (routine) without abnormal findings: Secondary | ICD-10-CM

## 2014-08-05 DIAGNOSIS — Z124 Encounter for screening for malignant neoplasm of cervix: Secondary | ICD-10-CM

## 2014-08-05 DIAGNOSIS — Z0000 Encounter for general adult medical examination without abnormal findings: Secondary | ICD-10-CM

## 2014-08-05 DIAGNOSIS — Z1151 Encounter for screening for human papillomavirus (HPV): Secondary | ICD-10-CM

## 2014-08-05 NOTE — Progress Notes (Signed)
Subjective:    Patricia Robertson is a 39 y.o. DW P1 37(39 yo daughter) female who presents for an annual exam. The patient has no complaints today. She is having intermittent since her second Mirena. (fewer days without vb than with vb) The patient is not currently sexually active. GYN screening history: last pap: was normal. The patient wears seatbelts: yes. The patient participates in regular exercise: yes. Has the patient ever been transfused or tattooed?: yes. The patient reports that there is not domestic violence in her life.   Menstrual History: OB History    Gravida Para Term Preterm AB TAB SAB Ectopic Multiple Living   1 1 1       1       Menarche age: [age]  Patient's last menstrual period was 07/22/2014.    The following portions of the patient's history were reviewed and updated as appropriate: allergies, current medications, past family history, past medical history, past social history, past surgical history and problem list.  Review of Systems A comprehensive review of systems was negative.    Objective:    BP 120/70 mmHg  Pulse 81  Resp 16  Ht 5\' 6"  (1.676 m)  Wt 202 lb (91.627 kg)  BMI 32.62 kg/m2  LMP 07/22/2014  General Appearance:    Alert, cooperative, no distress, appears stated age  Head:    Normocephalic, without obvious abnormality, atraumatic  Eyes:    PERRL, conjunctiva/corneas clear, EOM's intact, fundi    benign, both eyes  Ears:    Normal TM's and external ear canals, both ears  Nose:   Nares normal, septum midline, mucosa normal, no drainage    or sinus tenderness  Throat:   Lips, mucosa, and tongue normal; teeth and gums normal  Neck:   Supple, symmetrical, trachea midline, no adenopathy;    thyroid:  no enlargement/tenderness/nodules; no carotid   bruit or JVD  Back:     Symmetric, no curvature, ROM normal, no CVA tenderness  Lungs:     Clear to auscultation bilaterally, respirations unlabored  Chest Wall:    No tenderness or deformity   Heart:     Regular rate and rhythm, S1 and S2 normal, no murmur, rub   or gallop  Breast Exam:    No tenderness, masses, or nipple abnormality  Abdomen:     Soft, non-tender, bowel sounds active all four quadrants,    no masses, no organomegaly  Genitalia:    Normal female without lesion, discharge or tendernessn NSSmidplane, NT, normal adnexa, Mirena strings seen     Extremities:   Extremities normal, atraumatic, no cyanosis or edema  Pulses:   2+ and symmetric all extremities  Skin:   Skin color, texture, turgor normal, no rashes or lesions  Lymph nodes:   Cervical, supraclavicular, and axillary nodes normal  Neurologic:   CNII-XII intact, normal strength, sensation and reflexes    throughout  .    Assessment:    Healthy female exam.    Plan:     Breast self exam technique reviewed and patient encouraged to perform self-exam monthly.   Thin prep pap with cotesting

## 2014-08-05 NOTE — Addendum Note (Signed)
Addended by: Granville LewisLARK, LORA L on: 08/05/2014 02:26 PM   Modules accepted: Orders

## 2014-08-07 LAB — CYTOLOGY - PAP

## 2014-12-23 ENCOUNTER — Encounter: Payer: Self-pay | Admitting: Obstetrics & Gynecology

## 2014-12-23 ENCOUNTER — Ambulatory Visit (INDEPENDENT_AMBULATORY_CARE_PROVIDER_SITE_OTHER): Payer: BC Managed Care – PPO | Admitting: Obstetrics & Gynecology

## 2014-12-23 VITALS — BP 132/82 | HR 80 | Ht 66.25 in | Wt 197.0 lb

## 2014-12-23 DIAGNOSIS — Z30431 Encounter for routine checking of intrauterine contraceptive device: Secondary | ICD-10-CM | POA: Diagnosis not present

## 2014-12-23 DIAGNOSIS — Z113 Encounter for screening for infections with a predominantly sexual mode of transmission: Secondary | ICD-10-CM

## 2014-12-23 NOTE — Progress Notes (Addendum)
   Subjective:    Patient ID: Patricia Robertson, female    DOB: 24-Mar-1976, 39 y.o.   MRN: [id number]  HPI  39 yo WF her to have STI testing. She has a new partner and is being respectful of his health. No issues. She has had her Mirena for about 6 months and still has an unacceptable amount of old bloody discharge.  Review of Systems     Objective:   Physical Exam  [id number] Abd- benign      Assessment & Plan:  STI testing- all questions answered Annoying bloody discharge from Mirena- trial of OCPs prn

## 2014-12-24 ENCOUNTER — Telehealth: Payer: Self-pay

## 2014-12-24 ENCOUNTER — Telehealth: Payer: Self-pay | Admitting: *Deleted

## 2014-12-24 LAB — HEPATITIS B SURFACE ANTIGEN: Hepatitis B Surface Ag: NEGATIVE

## 2014-12-24 LAB — GC/CHLAMYDIA PROBE AMP
CT Probe RNA: NEGATIVE
GC PROBE AMP APTIMA: NEGATIVE

## 2014-12-24 LAB — HEPATITIS C ANTIBODY: HCV AB: NEGATIVE

## 2014-12-24 LAB — HIV ANTIBODY (ROUTINE TESTING W REFLEX): HIV: NONREACTIVE

## 2014-12-24 LAB — RPR

## 2014-12-24 NOTE — Telephone Encounter (Signed)
LM on voicemail of neg labs. 

## 2014-12-24 NOTE — Telephone Encounter (Signed)
Patient called and made aware that all STD testing from 12-23-14 was negative and within normal range. Patient states understanding. Armandina StammerJennifer Howard RN BSN 12-23-14

## 2015-03-16 ENCOUNTER — Emergency Department
Admission: EM | Admit: 2015-03-16 | Discharge: 2015-03-16 | Disposition: A | Discharge status: Home / Self Care | Payer: BC Managed Care – PPO | Source: Home / Self Care | Attending: Family Medicine | Admitting: Family Medicine

## 2015-03-16 DIAGNOSIS — J029 Acute pharyngitis, unspecified: Secondary | ICD-10-CM | POA: Diagnosis not present

## 2015-03-16 LAB — POCT RAPID STREP A (OFFICE): RAPID STREP A SCREEN: NEGATIVE

## 2015-03-16 MED ORDER — PENICILLIN V POTASSIUM 500 MG PO TABS: ORAL_TABLET | ORAL | Status: DC

## 2015-03-16 NOTE — Discharge Instructions (Signed)
Try warm salt water gargles for sore throat.  May take Ibuprofen 200mg, 4 tabs every 8 hours with food.  ° ° °Salt Water Gargle °This solution will help make your mouth and throat feel better. °HOME CARE INSTRUCTIONS  °· Mix 1 teaspoon of salt in 8 ounces of warm water. °· Gargle with this solution as much or often as you need or as directed. Swish and gargle gently if you have any sores or wounds in your mouth. °· Do not swallow this mixture. °Document Released: 04/08/2004 Document Revised: 09/27/2011 Document Reviewed: 08/30/2008 °Exit[employer]® Patient Information ©2015 [employer]. This information is not intended to replace advice given to you by your health care provider. Make sure you discuss any questions you have with your health care provider. ° °

## 2015-03-16 NOTE — ED Notes (Signed)
Started with a sore throat Friday at school, swollen lymph nodes, swollen adenoids per patient Throat red at present with blisters

## 2015-03-16 NOTE — ED Provider Notes (Signed)
CSN: [id number]     Arrival date & time 03/16/15  1350 History   First MD Initiated Contact with Patient 03/16/15 1513     Chief Complaint  Patient presents with  . Sore Throat      HPI Comments: Patient complains of two day history of sore throat and sore anterior neck, myalgias, headache, fatigue, and chills.  Ibuprofen helps somewhat.  She has mild nasal congestion but no cough or other URI symptoms.  The history is provided by the patient.    Past Medical History  Diagnosis Date  . Knee fracture   . Elbow fracture   . Anxiety   . Obesity    Past Surgical History  Procedure Laterality Date  . Tibial plateau repair     Family History  Problem Relation Age of Onset  . Alzheimer's disease Father   . Diabetes Mother   . Heart attack Maternal Grandfather   . Diabetes Maternal Grandfather    Social History  Substance Use Topics  . Smoking status: Never Smoker   . Smokeless tobacco: Never Used  . Alcohol Use: Yes     Comment: 1/week   OB History    Gravida Para Term Preterm AB TAB SAB Ectopic Multiple Living   1 1 1       1      Review of Systems + sore throat No cough No pleuritic pain No wheezing ? nasal congestion + post-nasal drainage No sinus pain/pressure No itchy/red eyes ? earache No hemoptysis No SOB No fever, + chills No nausea No vomiting No abdominal pain No diarrhea No urinary symptoms No skin rash + fatigue No myalgias + headache Used OTC meds without relief  Allergies  Review of patient's allergies indicates no known allergies.  Home Medications   Prior to Admission medications   Medication Sig Start Date End Date Taking? Authorizing Provider  AMBULATORY NON FORMULARY MEDICATION Monthly therapeutic massage for tension headaches. 05/04/13   [name] L Breeback, PA-C  celecoxib (CELEBREX) 100 MG capsule Take 100 mg by mouth 2 (two) times daily.    Historical Provider, MD  cetirizine (ZYRTEC) 10 MG tablet Take 10 mg by mouth daily.     Historical Provider, MD  clonazePAM (KLONOPIN) 0.5 MG tablet Take 0.5 mg by mouth 2 (two) times daily as needed for anxiety.    Historical Provider, MD  cyclobenzaprine (FLEXERIL) 10 MG tablet Take 10 mg by mouth 3 (three) times daily as needed for muscle spasms.    Historical Provider, MD  levonorgestrel (MIRENA) 20 MCG/24HR IUD 1 each by Intrauterine route once.    Historical Provider, MD  penicillin v potassium (VEETID) 500 MG tablet Take one tab by mouth twice daily for 10 days 03/16/15   Lattie Haw, MD  zolpidem (AMBIEN) 10 MG tablet  12/12/14   Historical Provider, MD   Meds Ordered and Administered this Visit  Medications - No data to display  BP 119/84 mmHg  Pulse 74  Temp(Src) 98.5 F (36.9 C) (Oral)  Ht 5\' 6"  (1.676 m)  Wt 205 lb (92.987 kg)  BMI 33.10 kg/m2  SpO2 100%  LMP 03/12/2015 (Approximate) No data found.   Physical Exam Nursing notes and Vital Signs reviewed. Appearance:  Patient appears stated age, and in no acute distress.  Patient is obese (BMI 33.1) Eyes:  Pupils are equal, round, and reactive to light and accomodation.  Extraocular movement is intact.  Conjunctivae are not inflamed  Ears:  Canals normal.  Tympanic membranes normal.  Nose:  Mildly congested turbinates.  No sinus tenderness.   Pharynx:  Erythematous and slightly swollen without obstruction.  Exudate present. Neck:  Supple.   Tender shotty tonsillar nodes are palpated bilaterally  Lungs:  Clear to auscultation.  Breath sounds are equal.  Moving air well. Heart:  Regular rate and rhythm without murmurs, rubs, or gallops.  Abdomen:  Nontender without masses or hepatosplenomegaly.  Bowel sounds are present.  No CVA or flank tenderness.  Extremities:  No edema. Skin:  No rash present.   ED Course  Procedures  None    Labs Reviewed  STREP A DNA PROBE  POCT RAPID STREP A (OFFICE) negative         MDM   1. Acute pharyngitis, unspecified pharyngitis type    CENTOR 3+ Throat culture  pending. Begin PenVK  BID for 10 days. Try warm salt water gargles for sore throat.  May take Ibuprofen , 4 tabs every 8 hours with food. Followup with Family Doctor if not improved in 10 days.    Lattie Haw, MD 03/18/15 [phone]

## 2015-03-17 ENCOUNTER — Telehealth: Payer: Self-pay | Admitting: *Deleted

## 2015-03-17 LAB — STREP A DNA PROBE: GASP: POSITIVE

## 2015-03-21 ENCOUNTER — Telehealth: Payer: Self-pay | Admitting: *Deleted

## 2015-03-21 NOTE — Telephone Encounter (Signed)
Pt left vm stating that the abx she got from UC is causing some GI upset.  Can we switch her to something else? Please advise.

## 2015-03-24 NOTE — Telephone Encounter (Signed)
Are you still having problems. Can switch to amoxil  bid for 10 days. NRF.

## 2015-03-25 NOTE — Telephone Encounter (Signed)
LMOM for pt to return call. 

## 2015-05-30 ENCOUNTER — Other Ambulatory Visit: Payer: Self-pay | Admitting: Physician Assistant

## 2015-05-30 MED ORDER — CETIRIZINE HCL 10 MG PO TABS: 10.0000 mg | ORAL_TABLET | Freq: Every day | ORAL | Status: DC

## 2015-05-30 MED ORDER — CLONAZEPAM 0.5 MG PO TABS: 0.5000 mg | ORAL_TABLET | Freq: Two times a day (BID) | ORAL | Status: DC | PRN

## 2015-07-10 ENCOUNTER — Encounter: Payer: Self-pay | Admitting: Obstetrics & Gynecology

## 2015-07-10 ENCOUNTER — Ambulatory Visit (INDEPENDENT_AMBULATORY_CARE_PROVIDER_SITE_OTHER): Payer: BC Managed Care – PPO | Admitting: Obstetrics & Gynecology

## 2015-07-10 VITALS — BP 112/75 | HR 63 | Resp 16 | Ht 66.25 in | Wt 203.0 lb

## 2015-07-10 DIAGNOSIS — N898 Other specified noninflammatory disorders of vagina: Secondary | ICD-10-CM

## 2015-07-10 DIAGNOSIS — L298 Other pruritus: Secondary | ICD-10-CM

## 2015-07-10 MED ORDER — METRONIDAZOLE 500 MG PO TABS: 500.0000 mg | ORAL_TABLET | Freq: Two times a day (BID) | ORAL | Status: DC

## 2015-07-10 MED ORDER — FLUCONAZOLE 150 MG PO TABS: 150.0000 mg | ORAL_TABLET | Freq: Once | ORAL | Status: DC

## 2015-07-10 NOTE — Progress Notes (Signed)
   Subjective:    Patient ID: Patricia Robertson, female    DOB: [birth year], 39 y.o.   MRN: [id number]  HPI  39 yo lady here with the complaint of vulvar itching and discharge. She has been monogamous for 7 months.  Review of Systems     Objective:   Physical Exam  [id number] Breathing, conversing, and ambulating normally Abd- benign EG- red with white discharge c/w yeast Spec exam reveals frothy white discharge near cervix      Assessment & Plan:  Yeast- diflucan Probable BV- flagyl Wet prep sent

## 2015-07-11 LAB — WET PREP, GENITAL
TRICH WET PREP: NONE SEEN
YEAST WET PREP: NONE SEEN

## 2015-09-02 ENCOUNTER — Ambulatory Visit (INDEPENDENT_AMBULATORY_CARE_PROVIDER_SITE_OTHER): Payer: BC Managed Care – PPO | Admitting: Physician Assistant

## 2015-09-02 ENCOUNTER — Encounter: Payer: Self-pay | Admitting: Physician Assistant

## 2015-09-02 VITALS — BP 118/75 | HR 77 | Temp 97.9°F | Ht 66.25 in | Wt 208.0 lb

## 2015-09-02 DIAGNOSIS — J029 Acute pharyngitis, unspecified: Secondary | ICD-10-CM

## 2015-09-02 DIAGNOSIS — S93401D Sprain of unspecified ligament of right ankle, subsequent encounter: Secondary | ICD-10-CM | POA: Diagnosis not present

## 2015-09-02 DIAGNOSIS — R51 Headache: Secondary | ICD-10-CM | POA: Diagnosis not present

## 2015-09-02 DIAGNOSIS — R519 Headache, unspecified: Secondary | ICD-10-CM

## 2015-09-02 LAB — POCT INFLUENZA A/B
Influenza A, POC: NEGATIVE
Influenza B, POC: NEGATIVE

## 2015-09-02 LAB — POCT RAPID STREP A (OFFICE): RAPID STREP A SCREEN: NEGATIVE

## 2015-09-02 MED ORDER — AMBULATORY NON FORMULARY MEDICATION: Status: DC

## 2015-09-02 NOTE — Patient Instructions (Signed)
Acute Ankle Sprain With Phase II Rehab An acute ankle sprain is a partial or complete tear in one or more of the ligaments of the ankle due to traumatic injury. The severity of the injury depends on both the number of ligaments sprained and the grade of sprain. There are 3 grades of sprains.  A grade 1 sprain is a mild sprain. There is a slight pull without obvious tearing. There is no loss of strength, and the muscle and ligament are the correct length.  A grade 2 sprain is a moderate sprain. There is tearing of fibers within the substance of the ligament where it connects two bones or two cartilages. The length of the ligament is increased, and there is usually decreased strength.  A grade 3 sprain is a complete rupture of the ligament and is uncommon. In addition to the grade of sprain, there are 3 types of ankle sprains.  Lateral ankle sprains. This is a sprain of one or more of the 3 ligaments on the outer side (lateral) of the ankle. These are the most common sprains. Medial ankle sprains. There is one large triangular ligament on the inner side (medial) of the ankle that is susceptible to injury. Medial ankle sprains are less common. Syndesmosis, "high ankle," sprains. The syndesmosis is the ligament that connects the two bones of the lower leg. Syndesmosis sprains usually only occur with very severe ankle sprains. SYMPTOMS  Pain, tenderness, and swelling in the ankle, starting at the side of injury that may progress to the whole ankle and foot with time.  "Pop" or tearing sensation at the time of injury.  Bruising that may spread to the heel.  Impaired ability to walk soon after injury. CAUSES   Acute ankle sprains are caused by trauma placed on the ankle that temporarily forces or pries the anklebone (talus) out of its normal socket.  Stretching or tearing of the ligaments that normally hold the joint in place (usually due to a twisting injury). RISK INCREASES WITH:  Previous  ankle sprain.  Sports in which the foot may land awkwardly (basketball, volleyball, soccer) or walking or running on uneven or rough surfaces.  Shoes with inadequate support to prevent sideways motion when stress occurs.  Poor strength and flexibility.  Poor balance skills.  Contact sports. PREVENTION  Warm up and stretch properly before activity.  Maintain physical fitness:  Ankle and leg flexibility, muscle strength, and endurance.  Cardiovascular fitness.  Balance training activities.  Use proper technique and have a coach correct improper technique.  Taping, protective strapping, bracing, or high-top tennis shoes may help prevent injury. Initially, tape is best. However, it loses most of its support function within 10 to 15 minutes.  Wear proper fitted protective shoes. Combining high-top shoes with taping or bracing is more effective than using either alone.  Provide the ankle with support during sports and practice activities for 12 months following injury. PROGNOSIS   If treated properly, ankle sprains can be expected to recover completely. However, the length of recovery depends on the degree of injury.  A grade 1 sprain usually heals enough in 5 to 7 days to allow modified activity and requires an average of 6 weeks to heal completely.  A grade 2 sprain requires 6 to 10 weeks to heal completely.  A grade 3 sprain requires 12 to 16 weeks to heal.  A syndesmosis sprain often takes more than 3 months to heal. RELATED COMPLICATIONS   Frequent recurrence of symptoms may result   in a chronic problem. Appropriately addressing the problem the first time decreases the frequency of recurrence and optimizes healing time. Severity of initial sprain does not predict the likelihood of later instability.  Injury to other structures (bone, cartilage, or tendon).  Chronically unstable or arthritic ankle joint are possible with repeated sprains. TREATMENT Treatment initially  involves the use of ice, medicine, and compression bandages to help reduce pain and inflammation. Ankle sprains are usually immobilized in a walking cast or boot to allow for healing. Crutches may be recommended to reduce pressure on the injury. After immobilization, strengthening and stretching exercises may be necessary to regain strength and a full range of motion. Surgery is rarely needed to treat ankle sprains. MEDICATION   Nonsteroidal anti-inflammatory medicines, such as aspirin and ibuprofen (do not take for the first 3 days after injury or within 7 days before surgery), or other minor pain relievers, such as acetaminophen, are often recommended. Take these as directed by your caregiver. Contact your caregiver immediately if any bleeding, stomach upset, or signs of an allergic reaction occur from these medicines.  Ointments applied to the skin may be helpful.  Pain relievers may be prescribed as necessary by your caregiver. Do not take prescription pain medicine for longer than 4 to 7 days. Use only as directed and only as much as you need. HEAT AND COLD  Cold treatment (icing) is used to relieve pain and reduce inflammation for acute and chronic cases. Cold should be applied for 10 to 15 minutes every 2 to 3 hours for inflammation and pain and immediately after any activity that aggravates your symptoms. Use ice packs or an ice massage.  Heat treatment may be used before performing stretching and strengthening activities prescribed by your caregiver. Use a heat pack or a warm soak. SEEK IMMEDIATE MEDICAL CARE IF:   Pain, swelling, or bruising worsens despite treatment.  You experience pain, numbness, discoloration, or coldness in the foot or toes.  New, unexplained symptoms develop. (Drugs used in treatment may produce side effects.) EXERCISES  PHASE II EXERCISES RANGE OF MOTION (ROM) AND STRETCHING EXERCISES - Ankle Sprain, Acute-Phase II, Weeks 3 to 4 After your physician, physical  therapist, or athletic trainer feels your knee has made progress significant enough to begin more advanced exercises, he or she may recommend completing some of the following exercises. Although each person heals at different rates, most people will be ready for these exercises between 3 and 4 weeks after their injury. Do not begin these exercises until you have your caregiver's permission. He or she may also advise you to continue with the exercises which you completed in Phase I of your rehabilitation. While completing these exercises, remember:   Restoring tissue flexibility helps normal motion to return to the joints. This allows healthier, less painful movement and activity.  An effective stretch should be held for at least 30 seconds.  A stretch should never be painful. You should only feel a gentle lengthening or release in the stretched tissue. RANGE OF MOTION - Ankle Plantar Flexion   Sit with your right / left leg crossed over your opposite knee.  Use your opposite hand to pull the top of your foot and toes toward you.  You should feel a gentle stretch on the top of your foot/ankle. Hold this position for __________. Repeat __________ times. Complete __________ times per day.  RANGE OF MOTION - Ankle Eversion  Sit with your right / left ankle crossed over your opposite knee.    Grip your foot with your opposite hand, placing your thumb on the top of your foot and your fingers across the bottom of your foot.  Gently push your foot downward with a slight rotation so your littlest toes rise slightly  You should feel a gentle stretch on the inside of your ankle. Hold the stretch for __________ seconds. Repeat __________ times. Complete this exercise __________ times per day.  RANGE OF MOTION - Ankle Inversion  Sit with your right / left ankle crossed over your opposite knee.  Grip your foot with your opposite hand, placing your thumb on the bottom of your foot and your fingers across  the top of your foot.  Gently pull your foot so the smallest toe comes toward you and your thumb pushes the inside of the ball of your foot away from you.  You should feel a gentle stretch on the outside of your ankle. Hold the stretch for __________ seconds. Repeat __________ times. Complete this exercise __________ times per day.  STRETCH - Gastrocsoleus  Sit with your right / left leg extended. Holding onto both ends of a belt or towel, loop it around the ball of your foot.  Keeping your right / left ankle and foot relaxed and your knee straight, pull your foot and ankle toward you using the belt/towel.  You should feel a gentle stretch behind your calf or knee. Hold this position for __________ seconds. Repeat __________ times. Complete this stretch __________ times per day.  RANGE OF MOTION - Ankle Dorsiflexion, Active Assisted  Remove shoes and sit on a chair that is preferably not on a carpeted surface.  Place right / left foot under knee. Extend your opposite leg for support.  Keeping your heel down, slide your right / left foot back toward the chair until you feel a stretch at your ankle or calf. If you do not feel a stretch, slide your bottom forward to the edge of the chair while still keeping your heel down.  Hold this stretch for __________ seconds. Repeat __________ times. Complete this stretch __________ times per day.  STRETCH - Gastroc, Standing   Place hands on wall.  Extend right / left leg and place a folded washcloth under the arch of your foot for support. Keep the front knee somewhat bent.  Slightly point your toes inward on your back foot.  Keeping your right / left heel on the floor and your knee straight, shift your weight toward the wall, not allowing your back to arch.  You should feel a gentle stretch in the calf. Hold this position for __________ seconds. Repeat __________ times. Complete this stretch __________ times per day. STRETCH - Soleus,  Standing  Place hands on wall.  Extend right / left leg and place a folded washcloth under the arch of your foot for support. Keep the front knee somewhat bent.  Slightly point your toes inward on your back foot.  Keep your right / left heel on the floor, bend your back knee, and slightly shift your weight over the back leg so that you feel a gentle stretch deep in your back calf.  Hold this position for __________ seconds. Repeat __________ times. Complete this stretch __________ times per day. STRETCH - Gastrocsoleus, Standing Note: This exercise can place a lot of stress on your foot and ankle. Please complete this exercise only if specifically instructed by your caregiver.   Place the ball of your right / left foot on a step, keeping your other   foot firmly on the same step.  Hold on to the wall or a rail for balance.  Slowly lift your other foot, allowing your body weight to press your heel down over the edge of the step.  You should feel a stretch in your right / left calf.  Hold this position for __________ seconds.  Repeat this exercise with a slight bend in your knee. Repeat __________ times. Complete this stretch __________ times per day.  STRENGTHENING EXERCISES - Ankle Sprain, Acute-Phase II Around 3 to 4 weeks after your injury, you may progress to some of these exercises in your rehabilitation program. Do not begin these until you have your caregiver's permission. Although your condition has improved, the Phase I exercises will continue to be helpful and you may continue to complete them. As you complete strengthening exercises, remember:   Strong muscles with good endurance tolerate stress better.  Do the exercises as initially prescribed by your caregiver. Progress slowly with each exercise, gradually increasing the number of repetitions and weight used under his or her guidance.  You may experience muscle soreness or fatigue, but the pain or discomfort you are trying  to eliminate should never worsen during these exercises. If this pain does worsen, stop and make certain you are following the directions exactly. If the pain is still present after adjustments, discontinue the exercise until you can discuss the trouble with your caregiver. STRENGTH - Plantar-flexors, Standing  Stand with your feet shoulder width apart. Steady yourself with a wall or table using as little support as needed.  Keeping your weight evenly spread over the width of your feet, rise up on your toes.*  Hold this position for __________ seconds. Repeat __________ times. Complete this exercise __________ times per day.  *If this is too easy, shift your weight toward your right / left leg until you feel challenged. Ultimately, you may be asked to do this exercise with your right / left foot only. STRENGTH - Dorsiflexors and Plantar-flexors, Heel/toe Walking  Dorsiflexion: Walk on your heels only. Keep your toes as high as possible.  Walk for ____________________ seconds/feet.  Repeat __________ times. Complete __________ times per day.  Plantar flexion: Walk on your toes only. Keep your heels as high as possible.  Walk for ____________________ seconds/feet. Repeat __________ times. Complete __________ times per day.  BALANCE - Tandem Walking  Place your uninjured foot on a line 2 to 4 inches wide and at least 10 feet long.  Keeping your balance without using anything for extra support, place your right / left heel directly in front of your other foot.  Slowly raise your back foot up, lifting from the heel to the toes, and place it directly in front of the right / left foot.  Continue to walk along the line slowly. Walk for ____________________ feet. Repeat ____________________ times. Complete ____________________ times per day. BALANCE - Inversion/Eversion Use caution, these are advanced level exercises. Do not begin them until you are advised to do so.   Create a balance  board using a sturdy board about 1  feet long and at 1 to 1  feet wide and a 1  inch diameter rod or pipe that is as long as the board's width. A copper pipe or a solid broomstick work well.  Stand on a non-carpeted surface near a countertop or wall. Step onto the board so that your feet are hip-width apart and equally straddle the rod/pipe.  Keeping your feet in place, complete these two exercises   without shifting your upper body or hips:  Tip the board from side-to-side. Control the movement so the board does not forcefully strike the ground. The board should silently tap the ground.  Tip the board side-to-side without striking the ground. Occasionally pause and maintain a steady position at various points.  Repeat the first two exercises, but use only your right / left foot. Place your right / left foot directly over the rod/pipe. Repeat __________ times. Complete this exercise __________ times a day. BALANCE - Plantar/Dorsi Flexion Use caution, these are advanced level exercises. Do not begin them until you are advised to do so.   Create a balance board using a sturdy board about 1  feet long and at 1 to 1  feet wide and a 1  inch diameter rod or pipe that is as long as the board's width. A copper pipe or a solid broomstick work well.  Stand on a non-carpeted surface near a countertop or wall. Stand on the board so that the rod/pipe runs under the arches in your feet.  Keeping your feet in place, complete these two exercises without shifting your upper body or hips:  Tip the board from side-to-side. Control the movement so the board does not forcefully strike the ground. The board should silently tap the ground.  Tip the board side-to-side without striking the ground. Occasionally pause and maintain a steady position at various points.  Repeat the first two exercises, but use only your right / left foot. Stand in the center of the board. Repeat __________ times. Complete this  exercise __________ times a day. STRENGTH - Plantar-flexors, Eccentric Note: This exercise can place a lot of stress on your foot and ankle. Please complete this exercise only if specifically instructed by your caregiver.   Place the balls of your feet on a step. With your hands, use only enough support from a wall or rail to keep your balance.  Keep your knees straight and rise up on your toes.  Slowly shift your weight entirely to your toes and pick up your opposite foot. Gently and with controlled movement, lower your weight through your right / left foot so that your heel drops below the level of the step. You will feel a slight stretch in the back of your calf at the ending position.  Use the healthy leg to help rise up onto the balls of both feet, then lower weight only on the right / left leg again. Build up to 15 repetitions. Then progress to 3 consecutive sets of 15 repetitions.*  After completing the above exercise, complete the same exercise with a slight knee bend (about 30 degrees). Again, build up to 15 repetitions. Then progress to 3 consecutive sets of 15 repetitions.* Perform this exercise __________ times per day.  *When you easily complete 3 sets of 15, your physician, physical therapist, or athletic trainer may advise you to add resistance by wearing a backpack filled with additional weight.   This information is not intended to replace advice given to you by your health care provider. Make sure you discuss any questions you have with your health care provider.   Document Released: 10/25/2005 Document Revised: 07/26/2014 Document Reviewed: 10/17/2008 [employer] Interactive Patient Education 2016 [employer]  

## 2015-09-02 NOTE — Progress Notes (Addendum)
   Subjective:    Patient ID: Patricia Robertson, female    DOB: November 10, 1975, 40 y.o.   MRN: [id number]  HPI  Patient is a 40 year old female presenting with "sore throat" for one day's duration. Patient states that sore throat awoke her from sleep last night and was restricted to the right side. Patient has also had malaise, body aches and "weakness". Additional concerns for this visit include addressing right ankle pain after a sprain that occurred two weeks ago and obtaining a massage for neck muscle spasms.  Patient denies nausea, abdominal pain, headache, diarrhea, constipation and SOB. Patient states that she has not taken her temperature. 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 Patient has tried cepacol lozenges and salt water gargles, which have provided her with limited relief.   Review of Systems    Please see HPI.  Objective:   Physical Exam  Constitutional: She is oriented to person, place, and time. She appears well-developed and well-nourished. No distress.  HENT:  Head: Normocephalic and atraumatic.  Nose: Nose normal.  Mouth/Throat: Oropharynx is clear and moist.  TMs are pearly and bony landmarks are visualized bilaterally. Patient is dark beneath her eyes.   Patient's posterior pharynx is mildly erythematous. No petechia or tonsillar exudate visualized. No cobblestoning visualized.   Eyes: Conjunctivae and EOM are normal. Pupils are equal, round, and reactive to light.  Neck: Normal range of motion. No tracheal deviation present. No thyromegaly present.  Cardiovascular: Normal rate, regular rhythm, normal heart sounds and intact distal pulses.   No murmur heard. Pulmonary/Chest: Effort normal and breath sounds normal. No respiratory distress. She has no wheezes. She has no rales.     Abdominal: Soft. Bowel sounds are normal. She exhibits no distension. There is no tenderness. There is no rebound and no guarding.  Musculoskeletal: Normal range  of motion.  Patient's right ankle is tender along the anterior talofibular ligament. Patient's ankle is mildly edematous. Patient exhibits full range of motion.   Neurological: She is alert and oriented to person, place, and time.  Skin: Skin is warm and dry. She is not diaphoretic.  Psychiatric: She has a normal mood and affect. Her behavior is normal. Judgment and thought content normal.     Assessment & Plan:   1. Acute Viral Upper Respiratory Tract Infection  Patient's rapid in office strep and influenza testing were negative. Strep culture sent off.Patient was advised to stay hydrated and to rest. Patient education was provided regarding uses salt water gargles and other symptomatic care for pharyngitis.   2. Right Ankle Sprain.  Patient states that she experiences right ankle pain when wearing high heels. She has been evaluated by [employer] comp doctor.  Patient sprained her ankle two weeks ago. Patient education was provided regarding exercises that can be done to restore range of motion. Patient was advised that ankle sprains can take up to 12 weeks to heal. Encouraged ace wrap, icing at night, good supportive shoes. Patient was advised to follow up if painful ankle symptoms can persist.   3. Neck Muscle Spasms Patient was prescribed massage for neck muscle spasms.

## 2015-09-04 LAB — CULTURE, GROUP A STREP: ORGANISM ID, BACTERIA: NORMAL

## 2016-01-05 ENCOUNTER — Encounter: Payer: Self-pay | Admitting: Physician Assistant

## 2016-01-05 ENCOUNTER — Ambulatory Visit (INDEPENDENT_AMBULATORY_CARE_PROVIDER_SITE_OTHER): Payer: BC Managed Care – PPO | Admitting: Physician Assistant

## 2016-01-05 VITALS — BP 123/70 | HR 68 | Ht 66.25 in | Wt 209.0 lb

## 2016-01-05 DIAGNOSIS — L989 Disorder of the skin and subcutaneous tissue, unspecified: Secondary | ICD-10-CM

## 2016-01-05 DIAGNOSIS — R1031 Right lower quadrant pain: Secondary | ICD-10-CM

## 2016-01-05 DIAGNOSIS — S82202D Unspecified fracture of shaft of left tibia, subsequent encounter for closed fracture with routine healing: Secondary | ICD-10-CM

## 2016-01-05 MED ORDER — CLINDAMYCIN PHOS-BENZOYL PEROX 1-5 % EX GEL: Freq: Two times a day (BID) | CUTANEOUS | Status: DC

## 2016-01-05 NOTE — Progress Notes (Signed)
   Subjective:    Patient ID: Patricia Robertson, female    DOB: [birth year], 40 y.o.   MRN: [id number]  HPI  Patient is a 40 year old female who presents to the clinic with a few concerns today.  Her #1 concern is referral to orthopedics. She had a left tibial fracture and 2012. There was 6 inches of metal placed in her legs with screws. She would like to have an opinion if she can get this taken out. It is causing a lot of tension in her leg. She feels like she can always feel it pulling when she walks or exercises.  She is also having some intermittent right lower quadrant pain. She noticed this after a sexual encounter. It almost felt like cramping for several days after. The pain was not constant but sharp and immediate. It has since went away. She continues to have normal periods she has not had this before or since. She denies any abnormal vaginal bleeding or discharge. She denies any fever or chills. She denies any changes in bowel movements.denies any urinary symptoms.   Patient is also concerned about some bumps on the backs of her legs near her buttocks. Notice for the past few weeks.  She has noticed them for a few weeks. They are irritating but do not hurt. They do not seem to be getting bigger. One did pop the other day injuring some clear liquid and almost scar. She has not tried anything else to make better.     Review of Systems    see HPI Objective:   Physical Exam  Constitutional: She is oriented to person, place, and time. She appears well-developed and well-nourished.  HENT:  Head: Normocephalic and atraumatic.  Cardiovascular: Normal rate, regular rhythm and normal heart sounds.   Pulmonary/Chest: Effort normal and breath sounds normal.  No CVA tenderness.   Abdominal: Soft. Bowel sounds are normal. She exhibits no distension and no mass. There is no tenderness. There is no rebound and no guarding.  Genitourinary:     Neurological: She is alert and oriented to person,  place, and time.  Psychiatric: She has a normal mood and affect. Her behavior is normal.          Assessment & Plan:  Left tibial fracture- Discuss with patient that I'm unclear if she appears candidate for hardware removal. I will [place] [name]  Right lower quadrant pain- unclear etiology. Reassured patient since it has resolved I would not consider going forward with any type of workup. Certainly he could be an ovarian cyst that pop and now she is symptom-free. If this pain comes about again she can call us and we can get an ultrasound of her pelvis.  Bumps on skin-unclear etiology. One bump actually looks like a pimple or the other 3 look like fluid-filled blisters more like molluscum contagiosum. I did do a viral culture and that is pending. If this is more of an acne variant sent BenzaClin is to place over the affected areas.

## 2016-01-09 LAB — REFLEX ADENOVIRUS CULTURE

## 2016-01-09 LAB — RFX HSV/VARICELLA ZOSTER RAPID CULT

## 2016-01-12 LAB — VIRAL CULTURE VIRC

## 2016-01-12 LAB — CYTOMEGALOVIRUS CULTURE

## 2016-01-19 ENCOUNTER — Other Ambulatory Visit: Payer: Self-pay | Admitting: *Deleted

## 2016-01-19 MED ORDER — FLUCONAZOLE 150 MG PO TABS: 150.0000 mg | ORAL_TABLET | Freq: Once | ORAL | Status: DC

## 2016-05-08 ENCOUNTER — Other Ambulatory Visit: Payer: Self-pay | Admitting: Physician Assistant

## 2016-08-09 ENCOUNTER — Ambulatory Visit: Payer: BC Managed Care – PPO | Admitting: Obstetrics & Gynecology

## 2016-08-13 ENCOUNTER — Ambulatory Visit (INDEPENDENT_AMBULATORY_CARE_PROVIDER_SITE_OTHER): Payer: BC Managed Care – PPO | Admitting: Physician Assistant

## 2016-08-13 VITALS — BP 115/82 | HR 75 | Wt 205.0 lb

## 2016-08-13 DIAGNOSIS — B9689 Other specified bacterial agents as the cause of diseases classified elsewhere: Secondary | ICD-10-CM | POA: Diagnosis not present

## 2016-08-13 DIAGNOSIS — N760 Acute vaginitis: Secondary | ICD-10-CM | POA: Diagnosis not present

## 2016-08-13 DIAGNOSIS — N898 Other specified noninflammatory disorders of vagina: Secondary | ICD-10-CM

## 2016-08-13 LAB — WET PREP FOR TRICH, YEAST, CLUE
CLUE CELLS WET PREP: NONE SEEN
Trich, Wet Prep: NONE SEEN
Yeast Wet Prep HPF POC: NONE SEEN

## 2016-08-13 MED ORDER — METRONIDAZOLE 500 MG PO TABS: 500.0000 mg | ORAL_TABLET | Freq: Two times a day (BID) | ORAL | 0 refills | Status: AC

## 2016-08-13 NOTE — Patient Instructions (Signed)
No alcohol with antibiotic Bacterial Vaginosis Bacterial vaginosis is a vaginal infection that occurs when the normal balance of bacteria in the vagina is disrupted. It results from an overgrowth of certain bacteria. This is the most common vaginal infection among women ages 5015-44. Because bacterial vaginosis increases your risk for STIs (sexually transmitted infections), getting treated can help reduce your risk for chlamydia, gonorrhea, herpes, and HIV (human immunodeficiency virus). Treatment is also important for preventing complications in pregnant women, because this condition can cause an early (premature) delivery. What are the causes? This condition is caused by an increase in harmful bacteria that are normally present in small amounts in the vagina. However, the reason that the condition develops is not fully understood. What increases the risk? The following factors may make you more likely to develop this condition:  Having a new sexual partner or multiple sexual partners.  Having unprotected sex.  Douching.  Having an intrauterine device (IUD).  Smoking.  Drug and alcohol abuse.  Taking certain antibiotic medicines.  Being pregnant. You cannot get bacterial vaginosis from toilet seats, bedding, swimming pools, or contact with objects around you. What are the signs or symptoms? Symptoms of this condition include:  Grey or white vaginal discharge. The discharge can also be watery or foamy.  A fish-like odor with discharge, especially after sexual intercourse or during menstruation.  Itching in and around the vagina.  Burning or pain with urination. Some women with bacterial vaginosis have no signs or symptoms. How is this diagnosed? This condition is diagnosed based on:  Your medical history.  A physical exam of the vagina.  Testing a sample of vaginal fluid under a microscope to look for a large amount of bad bacteria or abnormal cells. Your health care provider  may use a cotton swab or a small wooden spatula to collect the sample. How is this treated? This condition is treated with antibiotics. These may be given as a pill, a vaginal cream, or a medicine that is put into the vagina (suppository). If the condition comes back after treatment, a second round of antibiotics may be needed. Follow these instructions at home: Medicines  Take over-the-counter and prescription medicines only as told by your health care provider.  Take or use your antibiotic as told by your health care provider. Do not stop taking or using the antibiotic even if you start to feel better. General instructions  If you have a female sexual partner, tell her that you have a vaginal infection. She should see her health care provider and be treated if she has symptoms. If you have a female sexual partner, he does not need treatment.  During treatment:  Avoid sexual activity until you finish treatment.  Do not douche.  Avoid alcohol as directed by your health care provider.  Avoid breastfeeding as directed by your health care provider.  Drink enough water and fluids to keep your urine clear or pale yellow.  Keep the area around your vagina and rectum clean.  Wash the area daily with warm water.  Wipe yourself from front to back after using the toilet.  Keep all follow-up visits as told by your health care provider. This is important. How is this prevented?  Do not douche.  Wash the outside of your vagina with warm water only.  Use protection when having sex. This includes latex condoms and dental dams.  Limit how many sexual partners you have. To help prevent bacterial vaginosis, it is best to have sex with  just one partner (monogamous).  Make sure you and your sexual partner are tested for STIs.  Wear cotton or cotton-lined underwear.  Avoid wearing tight pants and pantyhose, especially during summer.  Limit the amount of alcohol that you drink.  Do not use  any products that contain nicotine or tobacco, such as cigarettes and e-cigarettes. If you need help quitting, ask your health care provider.  Do not use illegal drugs. Where to find more information:  Centers for Disease Control and Prevention: SolutionApps.co.za  American Sexual Health Association (ASHA): www.ashastd.org  U.S. Department of Health and Health and safety inspector, Office on Women's Health: ConventionalMedicines.si or http://www.anderson-williamson.info/ Contact a health care provider if:  Your symptoms do not improve, even after treatment.  You have more discharge or pain when urinating.  You have a fever.  You have pain in your abdomen.  You have pain during sex.  You have vaginal bleeding between periods. Summary  Bacterial vaginosis is a vaginal infection that occurs when the normal balance of bacteria in the vagina is disrupted.  Because bacterial vaginosis increases your risk for STIs (sexually transmitted infections), getting treated can help reduce your risk for chlamydia, gonorrhea, herpes, and HIV (human immunodeficiency virus). Treatment is also important for preventing complications in pregnant women, because the condition can cause an early (premature) delivery.  This condition is treated with antibiotic medicines. These may be given as a pill, a vaginal cream, or a medicine that is put into the vagina (suppository). This information is not intended to replace advice given to you by your health care provider. Make sure you discuss any questions you have with your health care provider. Document Released: 07/05/2005 Document Revised: 03/20/2016 Document Reviewed: 03/20/2016 Elsevier Interactive Patient Education  2017 [employer].

## 2016-08-13 NOTE — Progress Notes (Signed)
HPI:                                                                Patricia Robertson is a 41 y.o. female who presents to [facility] today for vaginal discomfort  Patients symptoms began last Thursday. She was seen in Urgent Care and diagnosed with yeast vaginitis (see wet prep below) and treated with 1 dose of Fluconazole and given an extra dose to take should symptoms return. Patient states symptoms never fully resolved and she took the 2nd dose on Wednesday. Patient continues to have vaginal discomfort. She denies any discharge. She is sexually active with 1 female partner, but she has abstained from intercourse since starting Fluconazole treatment last week. Denies fever, chills, nausea, vomiting, abdominal pain, dysuria, urinary frequency/urgency.   Past Medical History:  Diagnosis Date  . Anxiety   . Elbow fracture   . Knee fracture   . Obesity    Past Surgical History:  Procedure Laterality Date  . EYE SURGERY    . tibial plateau repair     Social History  Substance Use Topics  . Smoking status: Never Smoker  . Smokeless tobacco: Never Used  . Alcohol use Yes     Comment: 1/week   family history includes Alzheimer's disease in her father; Diabetes in her maternal grandfather and mother; Heart attack in her maternal grandfather.  ROS: negative except as noted in the HPI  Medications: Current Outpatient Prescriptions  Medication Sig Dispense Refill  . AMBULATORY NON FORMULARY MEDICATION Every 3 weeks therapeutic massage for tension headaches. 1 application 0  . cetirizine (ZYRTEC) 10 MG tablet TAKE 1 TABLET BY MOUTH EVERY DAY 90 tablet 0  . clindamycin-benzoyl peroxide (BENZACLIN) gel Apply topically 2 (two) times daily. 60 g 0  . clonazePAM (KLONOPIN) 0.5 MG tablet Take 1 tablet (0.5 mg total) by mouth 2 (two) times daily as needed for anxiety. 60 tablet 1  . cyclobenzaprine (FLEXERIL) 10 MG tablet Take 10 mg by mouth 3 (three)  times daily as needed for muscle spasms.    . fluconazole (DIFLUCAN) 150 MG tablet Take 1 tablet (150 mg total) by mouth once. 1 tablet 0  . levonorgestrel (MIRENA) 20 MCG/24HR IUD 1 each by Intrauterine route once.    . metroNIDAZOLE (FLAGYL) 500 MG tablet Take 1 tablet (500 mg total) by mouth 2 (two) times daily. 14 tablet 0   No current facility-administered medications for this visit.    No Known Allergies     Objective:  BP 115/82   Pulse 75   Wt 205 lb (93 kg)   BMI 32.84 kg/m  Gen: well-groomed, cooperative, not ill-appearing, no distress GU: vulva without rashes, lesions or erythema; vaginal canal with moderate amount of thin, white discharge, normal cervix Lymph: no inguinal lymphadenopathy Skin: no rashes or lesions  Collection Time: 08/07/16 1:29 PM  Result Value Ref Range  Specimen Description Vaginal Swab  WBC Few (A) Negative, Rare  Clue Cells Negative Negative  Yeast Positive (A) Negative  Trichomonas Negative Negative    Assessment and Plan: 41 y.o. female with  Vaginal discharge - WET PREP FOR TRICH, YEAST, CLUE pending  Bacterial vaginosis - since patient has failed Fluconazole x 2 and discharge did not  appear yeast-like on exam today, treating empirically for BV - instructed to avoid alcohol while taking antibiotic - metroNIDAZOLE (FLAGYL) 500 MG tablet; Take 1 tablet (500 mg total) by mouth 2 (two) times daily.  Dispense: 14 tablet; Refill: 0   Patient education and anticipatory guidance given Patient agrees with treatment plan Follow-up as needed if symptoms worsen or fail to improve  Levonne Hubertharley E. Rickey Farrier PA-C

## 2016-08-16 ENCOUNTER — Ambulatory Visit: Payer: BC Managed Care – PPO | Admitting: Advanced Practice Midwife

## 2016-09-15 ENCOUNTER — Ambulatory Visit (INDEPENDENT_AMBULATORY_CARE_PROVIDER_SITE_OTHER): Payer: BC Managed Care – PPO | Admitting: Physician Assistant

## 2016-09-15 ENCOUNTER — Encounter: Payer: Self-pay | Admitting: Physician Assistant

## 2016-09-15 VITALS — BP 137/82 | HR 71 | Ht 66.25 in | Wt 205.0 lb

## 2016-09-15 DIAGNOSIS — L298 Other pruritus: Secondary | ICD-10-CM | POA: Diagnosis not present

## 2016-09-15 DIAGNOSIS — L918 Other hypertrophic disorders of the skin: Secondary | ICD-10-CM

## 2016-09-15 DIAGNOSIS — B081 Molluscum contagiosum: Secondary | ICD-10-CM | POA: Diagnosis not present

## 2016-09-15 DIAGNOSIS — N898 Other specified noninflammatory disorders of vagina: Secondary | ICD-10-CM

## 2016-09-15 LAB — WET PREP FOR TRICH, YEAST, CLUE
CLUE CELLS WET PREP: NONE SEEN
Trich, Wet Prep: NONE SEEN
Yeast Wet Prep HPF POC: NONE SEEN

## 2016-09-15 MED ORDER — HYDROCORTISONE VALERATE 0.2 % EX CREA: 1.0000 "application " | TOPICAL_CREAM | Freq: Two times a day (BID) | CUTANEOUS | 0 refills | Status: DC

## 2016-09-15 NOTE — Progress Notes (Signed)
   Subjective:    Patient ID: Patricia Robertson, female    DOB: 1976-04-06, 41 y.o.   MRN: [id number]  HPI  Pt is a 41 yo female who presents to the clinic with vaginal irritation. A few months ago dx with BV and then treated again after wet prep came back negative but WBC on prep. Metronidazole did help previous symptoms but this feels different. She feels more itchy than anything. Denies any discharge or odor. She is sexually active with one female partner. She denies painful intercourse. Denies any increased urination, fever, chills, abdominal or flank pain. She admits to using a new toiletry wipe that she has not used before until last few months. She uses it a few times a day. She did take diflucan tablet 2 days ago.    Review of Systems  All other systems reviewed and are negative.      Objective:   Physical Exam  Constitutional: She appears well-developed and well-nourished.  Abdominal: Soft. Bowel sounds are normal. She exhibits no distension and no mass. There is no tenderness. There is no rebound and no guarding.  Genitourinary:  Genitourinary Comments: Labia minora and majora are erythematous and tender to palpation. No active discharge. No vaginal lesions. No odor noticed.   Skin:     Psychiatric: She has a normal mood and affect. Her behavior is normal.          Assessment & Plan:  Marland Kitchen.Marland Kitchen.Delaney Meigsamara was seen today for vaginal itching.  Diagnoses and all orders for this visit:  Vaginal itching -     WET PREP FOR TRICH, YEAST, CLUE -     hydrocortisone valerate cream (WESTCORT) 0.2 %; Apply 1 application topically 2 (two) times daily.  Skin tag  skin bumps   .Marland Kitchen. Results for orders placed or performed in visit on 09/15/16  WET PREP FOR TRICH, YEAST, CLUE  Result Value Ref Range   Yeast Wet Prep HPF POC NONE SEEN NONE SEEN   Trich, Wet Prep NONE SEEN NONE SEEN   Clue Cells Wet Prep HPF POC NONE SEEN NONE SEEN   WBC, Wet Prep HPF POC FEW NONE SEEN   Wet prep negative for  BV.  Will treat for more of a contact dermatitis with westcort.  STOP using wet wipes.  Keep area clean in dry with NO scents.  Boric acid discussed to keep vaginal pH 4.5 or less. HO given. Oral poison risk discussed.   Cryotherapy Procedure Note  Pre-operative Diagnosis: skin tags and bumps possible molluscum.  Post-operative Diagnosis: same  Locations: right inner groin and outside right labia majora  Indications: irritation  Procedure Details  History of allergy to iodine: no. Pacemaker? no.  Patient informed of risks (permanent scarring, infection, light or dark discoloration, bleeding, infection, weakness, numbness and recurrence of the lesion) and benefits of the procedure and verbal informed consent obtained.  The areas are treated with liquid nitrogen therapy, frozen until ice ball extended 3 mm beyond lesion, allowed to thaw, and treated again. The patient tolerated procedure well.  The patient was instructed on post-op care, warned that there may be blister formation, redness and pain. Recommend OTC analgesia as needed for pain.  Condition: Stable  Complications: none.  Plan: 1. Instructed to keep the area dry and covered for 24-48h and clean thereafter. 2. Warning signs of infection were reviewed.   3. Recommended that the patient use OTC acetaminophen as needed for pain.  4. Return in 2 weeks.

## 2016-09-15 NOTE — Progress Notes (Signed)
Call pt: negative for yeast, trich, BV. I would go ahead and use boric acid suppository every 3 days then weekly then as needed by vaginal pH.

## 2016-09-23 ENCOUNTER — Telehealth: Payer: Self-pay | Admitting: *Deleted

## 2016-09-23 DIAGNOSIS — S82143A Displaced bicondylar fracture of unspecified tibia, initial encounter for closed fracture: Secondary | ICD-10-CM

## 2016-09-23 NOTE — Telephone Encounter (Signed)
MRI of left knee ordered per pt request.  She has been waiting for almost a year for ortho to order it and wants it done with.

## 2016-09-29 ENCOUNTER — Telehealth: Payer: Self-pay | Admitting: Physician Assistant

## 2016-09-29 NOTE — Telephone Encounter (Signed)
Call pt: insurance did not approve MRI of knee. I think would be better if othro ordered; however if not we at least have to do a xray of knee before approval granted.

## 2016-09-30 NOTE — Telephone Encounter (Signed)
Pt.notified

## 2016-10-18 ENCOUNTER — Encounter: Payer: Self-pay | Admitting: Physician Assistant

## 2016-10-18 ENCOUNTER — Ambulatory Visit (INDEPENDENT_AMBULATORY_CARE_PROVIDER_SITE_OTHER): Payer: BC Managed Care – PPO | Admitting: Physician Assistant

## 2016-10-18 VITALS — BP 118/81 | HR 77 | Ht 66.25 in | Wt 210.0 lb

## 2016-10-18 DIAGNOSIS — N760 Acute vaginitis: Secondary | ICD-10-CM

## 2016-10-18 DIAGNOSIS — Z13220 Encounter for screening for lipoid disorders: Secondary | ICD-10-CM

## 2016-10-18 DIAGNOSIS — R7301 Impaired fasting glucose: Secondary | ICD-10-CM

## 2016-10-18 DIAGNOSIS — Z131 Encounter for screening for diabetes mellitus: Secondary | ICD-10-CM | POA: Diagnosis not present

## 2016-10-18 LAB — POCT URINALYSIS DIPSTICK
BILIRUBIN UA: NEGATIVE
Glucose, UA: NEGATIVE
Ketones, UA: NEGATIVE
Leukocytes, UA: NEGATIVE
NITRITE UA: NEGATIVE
PH UA: 5.5 (ref 5.0–8.0)
PROTEIN UA: NEGATIVE
Spec Grav, UA: 1.025 (ref 1.030–1.035)
Urobilinogen, UA: 0.2 (ref ?–2.0)

## 2016-10-18 LAB — COMPLETE METABOLIC PANEL WITH GFR
ALBUMIN: 3.8 g/dL (ref 3.6–5.1)
ALK PHOS: 62 U/L (ref 33–115)
ALT: 15 U/L (ref 6–29)
AST: 14 U/L (ref 10–30)
BUN: 13 mg/dL (ref 7–25)
CALCIUM: 8.7 mg/dL (ref 8.6–10.2)
CO2: 25 mmol/L (ref 20–31)
CREATININE: 0.67 mg/dL (ref 0.50–1.10)
Chloride: 108 mmol/L (ref 98–110)
GFR, Est African American: >89 mL/min (ref >=60)
GFR, Est Non African American: >89 mL/min (ref >=60)
GLUCOSE: 116 mg/dL — AB (ref 65–99)
POTASSIUM: 4.3 mmol/L (ref 3.5–5.3)
SODIUM: 139 mmol/L (ref 135–146)
TOTAL PROTEIN: 6.2 g/dL (ref 6.1–8.1)
Total Bilirubin: 0.5 mg/dL (ref 0.2–1.2)

## 2016-10-18 LAB — LIPID PANEL
CHOL/HDL RATIO: 4.1 ratio (ref <5.0)
Cholesterol: 157 mg/dL (ref <200)
HDL: 38 mg/dL — ABNORMAL LOW (ref >50)
LDL Cholesterol: 110 mg/dL — ABNORMAL HIGH (ref <100)
Triglycerides: 45 mg/dL (ref <150)
VLDL: 9 mg/dL (ref <30)

## 2016-10-18 NOTE — Progress Notes (Signed)
   Subjective:    Patient ID: Patricia Robertson, female    DOB: 08/26/1975, 41 y.o.   MRN: [id number]  HPI Pt is a 41 yo female who presents to the clinic with recurrent vaginitis symptoms. She has noticed every month around her cycle she has some vaginal irritation and white discharge. She just feels irritated down there. Sex is uncomfortable. No abdominal pain, fever, chills. No dysuria. She started yeast suppository and symptoms have resolved. She denies any odor. She is sexually active with one partner.     Review of Systems    see HPI.  Objective:   Physical Exam  Constitutional: She is oriented to person, place, and time. She appears well-developed and well-nourished.  HENT:  Head: Normocephalic and atraumatic.  Eyes: Conjunctivae are normal.  Neck: Normal range of motion. Neck supple.  Cardiovascular: Normal rate, regular rhythm and normal heart sounds.   Pulmonary/Chest: Effort normal and breath sounds normal.  No CVA tenderness.   Abdominal: Soft. Bowel sounds are normal. She exhibits no distension and no mass. There is no tenderness. There is no rebound and no guarding.  Neurological: She is alert and oriented to person, place, and time.  Psychiatric: She has a normal mood and affect. Her behavior is normal.          Assessment & Plan:  Marland KitchenMarland KitchenDiagnoses and all orders for this visit:  Recurrent vaginitis -     POCT urinalysis dipstick  Screening for lipid disorders -     Lipid panel  Screening for diabetes mellitus -     COMPLETE METABOLIC PANEL WITH GFR  Elevated fasting glucose -     Hemoglobin A1c      .Marland Kitchen Results for orders placed or performed in visit on 10/18/16  POCT urinalysis dipstick  Result Value Ref Range   Color, UA yellow    Clarity, UA slightly cloudy    Glucose, UA neg    Bilirubin, UA neg    Ketones, UA neg    Spec Grav, UA 1.025 1.030 - 1.035   Blood, UA trace-lysed    pH, UA 5.5 5.0 - 8.0   Protein, UA neg    Urobilinogen, UA 0.2  Negative - 2.0   Nitrite, UA neg    Leukocytes, UA Negative Negative   Negative for glucose in urine to be causing vaginitis.  Will do DM work up due to elevated fasting glucose in history.  Discussed to keep journal detailed for sexual. See if there is any contact dermaitis with sexual toys or to partner.  For now start culturelle and yeast suppositories around cycle. Symptoms sound more like yeast than BV.  I did not do a wet prep since did self treatment and not symptomatic today.

## 2016-10-19 ENCOUNTER — Encounter: Payer: Self-pay | Admitting: Physician Assistant

## 2016-10-19 LAB — HEMOGLOBIN A1C
HEMOGLOBIN A1C: 5.2 % (ref <5.7)
MEAN PLASMA GLUCOSE: 103 mg/dL

## 2017-01-18 ENCOUNTER — Ambulatory Visit (INDEPENDENT_AMBULATORY_CARE_PROVIDER_SITE_OTHER): Payer: BC Managed Care – PPO | Admitting: Osteopathic Medicine

## 2017-01-18 ENCOUNTER — Encounter: Payer: Self-pay | Admitting: Osteopathic Medicine

## 2017-01-18 VITALS — BP 117/52 | HR 68 | Wt 214.0 lb

## 2017-01-18 DIAGNOSIS — M62830 Muscle spasm of back: Secondary | ICD-10-CM

## 2017-01-18 MED ORDER — CYCLOBENZAPRINE HCL 10 MG PO TABS: 5.0000 mg | ORAL_TABLET | Freq: Three times a day (TID) | ORAL | 1 refills | Status: DC | PRN

## 2017-01-18 MED ORDER — MELOXICAM 15 MG PO TABS: ORAL_TABLET | ORAL | 3 refills | Status: DC

## 2017-01-18 NOTE — Patient Instructions (Addendum)
Plan:  Can take Meloxicam (anti-inflammatory) daily then as needed, tan take Flexeril as needed, can add Tylenol as needed.   Flexeril - can cause sleepiness, don't drive or drink on this medicine

## 2017-01-18 NOTE — Progress Notes (Signed)
HPI: Patricia Robertson is a 41 y.o. female  who presents to [facility] today, 01/18/17,  for chief complaint of:  Chief Complaint  Patient presents with  . Back Pain    x 4 days - ibuprofen did relieve most of the pain. Pain is a 10/10 at times.    . Context: no injury . Location: mid-back pain . Quality: cramping, dull/sharp alternating . Severity: Didn't take Ibuprofen last night while out drinking and pain 10/10 last night, now 5/10 . Duration: 4 days . Modifying factors: Ibuprofen helped a bit, massage also helped as well as BioFreeze. Didn't take Ibuprofen last night while out drinking and pain 10/10 last night, now 5/10. Hydrocodone and Tramadol after surgery but she didn't like either of these medicines.    Past medical history, surgical history, social history and family history reviewed.  Patient Active Problem List   Diagnosis Date Noted  . Fracture of radial head, left, closed 03/04/2014  . 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 Migraine headache 05/25/2013  . Neck muscle spasm 05/25/2013  . Anxiety and depression 11/06/2012  . Left tibial fracture 11/06/2012    Current medication list and allergy/intolerance information reviewed.   Current Outpatient Prescriptions on File Prior to Visit  Medication Sig Dispense Refill  . AMBULATORY NON FORMULARY MEDICATION Every 3 weeks therapeutic massage for tension headaches. 1 application 0  . clindamycin-benzoyl peroxide (BENZACLIN) gel Apply topically 2 (two) times daily. 60 g 0  . clonazePAM (KLONOPIN) 0.5 MG tablet Take 1 tablet (0.5 mg total) by mouth 2 (two) times daily as needed for anxiety. 60 tablet 1  . hydrocortisone valerate cream (WESTCORT) 0.2 % Apply 1 application topically 2 (two) times daily. 45 g 0  . levonorgestrel (MIRENA) 20 MCG/24HR IUD 1 each by Intrauterine route once.     No current facility-administered medications on file prior to visit.    No Known  Allergies    Review of Systems:  Constitutional: No recent illness  HEENT: No  headache, no vision change  Cardiac: No  chest pain  Respiratory:  No  shortness of breath  Gastrointestinal: No  abdominal pain, no change on bowel habits  Musculoskeletal: +new myalgia/arthralgia  Neurologic: No  weakness  Exam:  BP (!) 117/52   Pulse 68   Wt 214 lb (97.1 kg)   SpO2 100%   BMI 34.28 kg/m   Constitutional: VS see above. General Appearance: alert, well-developed, well-nourished, NAD  Eyes: Normal lids and conjunctive, non-icteric sclera  Ears, Nose, Mouth, Throat: MMM, Normal external inspection ears/nares/mouth/lips/gums.  Neck: No masses, trachea midline.   Respiratory: Normal respiratory effort.   Musculoskeletal: Gait normal. Symmetric and independent movement of all extremities. No midline spinal tenderness. Positive paraspinal muscle spasm to right of lower thoracic region, worse with flexion/extension,  Neurological: Normal balance/coordination. No tremor.  Skin: warm, dry, intact.   Psychiatric: Normal judgment/insight. Normal mood and affect. Oriented x3.      ASSESSMENT/PLAN: No alarm symptoms to concern for urgent imaging, treat with conservative measures and consider physical therapy if not better with home exercises. Follow-up in 2-4 weeks if no improvement, sooner if worse  Back muscle spasm - Plan: meloxicam (MOBIC) 15 MG tablet, cyclobenzaprine (FLEXERIL) 10 MG tablet, Ambulatory referral to Physical Therapy    Patient Instructions  Plan:  Can take Meloxicam (anti-inflammatory) daily then as needed, tan take Flexeril as needed, can add Tylenol as needed.   Flexeril - can cause sleepiness, don't  drive or drink on this medicine     Follow-up plan: Return if symptoms worsen or fail to improve.  Visit summary with medication list and pertinent instructions was printed for patient to review, alert us if any changes needed. All questions at time of  visit were answered - patient instructed to contact office with any additional concerns. ER/RTC precautions were reviewed with the patient and understanding verbalized.

## 2017-02-28 ENCOUNTER — Encounter: Payer: Self-pay | Admitting: Family Medicine

## 2017-02-28 ENCOUNTER — Ambulatory Visit (INDEPENDENT_AMBULATORY_CARE_PROVIDER_SITE_OTHER): Payer: BC Managed Care – PPO | Admitting: Family Medicine

## 2017-02-28 VITALS — BP 113/62 | HR 61 | Temp 97.8°F | Wt 213.0 lb

## 2017-02-28 DIAGNOSIS — L918 Other hypertrophic disorders of the skin: Secondary | ICD-10-CM | POA: Diagnosis not present

## 2017-02-28 MED ORDER — ERYTHROMYCIN 5 MG/GM OP OINT
TOPICAL_OINTMENT | OPHTHALMIC | 0 refills | Status: DC
Start: 2017-02-28 — End: 2019-02-08

## 2017-02-28 NOTE — Progress Notes (Signed)
Patricia Robertson is a 41 y.o. female who [facility] today for right eye problem.  Patient reports having a stye on right eyelid last week. The stye is now resolved, however, there remains a piece of skin present on the eyelid. Patient denies any inciting event, eye redness, or eye itching. Patient reports that area over eyelid has been crusting and she has removed some pieces of dry skin over the last few days. Patient endorses congestion, nasal discharge, and sore throat recently, but she attributes this to her allergies and nasal drainage.    Past Medical History:  Diagnosis Date  . Anxiety   . Elbow fracture   . Knee fracture   . Obesity    Past Surgical History:  Procedure Laterality Date  . EYE SURGERY    . tibial plateau repair     Social History  Substance Use Topics  . Smoking status: Never Smoker  . Smokeless tobacco: Never Used  . Alcohol use Yes     Comment: 1/week   family history includes Alzheimer's disease in her father; Diabetes in her maternal grandfather and mother; Heart attack in her maternal grandfather.  ROS as above:  Medications: Current Outpatient Prescriptions  Medication Sig Dispense Refill  . AMBULATORY NON FORMULARY MEDICATION Every 3 weeks therapeutic massage for tension headaches. 1 application 0  . clonazePAM (KLONOPIN) 0.5 MG tablet Take 1 tablet (0.5 mg total) by mouth 2 (two) times daily as needed for anxiety. 60 tablet 1  . cyclobenzaprine (FLEXERIL) 10 MG tablet Take 0.5-1 tablets (5-10 mg total) by mouth 3 (three) times daily as needed for muscle spasms. 30 tablet 1  . fluticasone (FLONASE) 50 MCG/ACT nasal spray Place 1 spray into both nostrils daily as needed.    Marland Kitchen. levonorgestrel (MIRENA) 20 MCG/24HR IUD 1 each by Intrauterine route once.    Marland Kitchen. erythromycin ophthalmic ointment Apply small dot to crusted area  right eyelid BID for 5 days. 3.5 g 0   No current facility-administered medications for this visit.    Allergies  Allergen Reactions  . Lactose Intolerance (Gi)     Other reaction(s): Other BLOATING AND STOMACH PAIN   . Eggs Or Egg-Derived Products Nausea Only    "MIGRAINE HEADACHE"    Health Maintenance Health Maintenance  Topic Date Due  . INFLUENZA VACCINE  02/16/2017  . PAP SMEAR  08/05/2017  . TETANUS/TDAP  03/10/2023  . HIV Screening  Completed     Exam:  BP 113/62   Pulse 61   Temp 97.8 F (36.6 C) (Oral)   Wt 213 lb (96.6 kg)   BMI 34.12 kg/m  Gen: Well NAD, sitting comfortably in chair HEENT: EOMI,  MMM, dry,crusty area of skin protruding from right eyelid Lungs: Normal work of breathing. CTABL Heart: RRR, normal S1 and S2, no MRG Abd: NABS, Soft. Nondistended, Nontender Exts: Brisk capillary refill, warm and well perfused.    No results found for this or any previous visit (from the past 72 hour(s)). No results found.    Assessment and Plan: 41 y.o. female with right eye problem. Given onset of symptoms, spontaneous resolution, and physical exam findings, this is likely a skin tag that became irritated. This could also be a closed comedone. Area was partially removed in clinic with needle forceps and disposable comedone extractor. It was difficult to be more aggressive with this due to the location of the area. Patient was given a  prescription for erythromycin ophthalmic ointment. She will apply to the small crusted area for 5 days. This may require a referral to Ophthalmology for removal if it does not resolve with the ointment and continues to be bothersome.   No orders of the defined types were placed in this encounter.  Meds ordered this encounter  Medications  . erythromycin ophthalmic ointment    Sig: Apply small dot to crusted area right eyelid BID for 5 days.    Dispense:  3.5 g    Refill:  0     Discussed warning signs or symptoms. Please  see discharge instructions. Patient expresses understanding.

## 2017-02-28 NOTE — Patient Instructions (Signed)
Thank you for coming in today. Apply the ointment to the eyelid twice daily for a few days.  Follow up with Opthalmology if not better.   Recheck as needed.    Skin Tag, Adult A skin tag (acrochordon) is a soft, extra growth of skin. Most skin tags are flesh-colored and rarely bigger than a pencil eraser. They commonly form near areas where there are folds in the skin, such as the armpit or groin. Skin tags are not dangerous, and they do not spread from person to person (are not contagious). You may have one skin tag or several. Skin tags do not require treatment. However, your health care provider may recommend removal of a skin tag if it:  Gets irritated from clothing.  Bleeds.  Is visible and unsightly.  Your health care provider can remove skin tags with a simple surgical procedure or a procedure that involves freezing the skin tag. Follow these instructions at home:  Watch for any changes in your skin tag. A normal skin tag does not require any other special care at home.  Take over-the-counter and prescription medicines only as told by your health care provider.  Keep all follow-up visits as told by your health care provider. This is important. Contact a health care provider if:  You have a skin tag that: ? Becomes painful. ? Changes color. ? Bleeds. ? Swells.  You develop more skin tags. This information is not intended to replace advice given to you by your health care provider. Make sure you discuss any questions you have with your health care provider. Document Released: 07/20/2015 Document Revised: 02/29/2016 Document Reviewed: 07/20/2015 Elsevier Interactive Patient Education  [employer]2018 Elsevier Inc.

## 2017-08-17 ENCOUNTER — Other Ambulatory Visit: Payer: Self-pay | Admitting: Physician Assistant

## 2017-08-17 MED ORDER — CLONAZEPAM 0.5 MG PO TABS: 0.5000 mg | ORAL_TABLET | Freq: Two times a day (BID) | ORAL | 1 refills | Status: DC | PRN

## 2017-11-15 ENCOUNTER — Telehealth: Payer: Self-pay | Admitting: Physician Assistant

## 2017-11-15 NOTE — Telephone Encounter (Signed)
Patient states she has left Patricia Robertson several messages and has not heard anything back, She wants to know if she can get her Mirena birth control out and does [name] recommend the copper Birth control for her with her history? Pt is requesting a call from [place].

## 2017-11-15 NOTE — Telephone Encounter (Signed)
I love the paraguard copper IUD but it does not stop or lessens periods. It does last longer and has NO hormones in it. I can remove IUDs but I do not put them in. Does she have a GYN or we have a provider in office that will put IUD in for her. Does she have any concerns?

## 2017-11-16 NOTE — Telephone Encounter (Signed)
I recommend if she is having dysfunctional uterine bleeding to NOT do paraguard as it will not help with bleeding at all.

## 2017-11-16 NOTE — Telephone Encounter (Signed)
She is having some difficult and irregular bleeding. Is there something else you would recommend?

## 2017-11-16 NOTE — Telephone Encounter (Signed)
It seems like she would be a good candidate for IUD or ablation. Talk to GYN first. If ablation is an option then I would do that before hormones to control.

## 2017-11-16 NOTE — Telephone Encounter (Signed)
Pt advised of PCP's recommendation. She see's Dr Marice Potter and has questions regarding an ablation. I advised her to contact Dr Marice Potter and see if she would be a candidate. They also can give some insight on pro's/con's for her on that procedure. Pt will contact their office and then let us know what next step she is going to take. If she does go with the IUD, she would like it to be done in our office due to cheaper co pay. No further questions at this time.

## 2017-11-18 NOTE — Telephone Encounter (Signed)
Left VM for Pt to see what GYN advised her to do.

## 2017-11-23 ENCOUNTER — Telehealth: Payer: Self-pay | Admitting: *Deleted

## 2017-11-23 NOTE — Telephone Encounter (Signed)
Patient would like Dr. Marice Potter to give her a call or email about IUD concerns that she has. [phone] or [email or web address]

## 2017-11-24 NOTE — Telephone Encounter (Signed)
I returned her call. She would like her IUD removed. Told her to schedule appt for this. She wanted a full discussion of her birth control/menstrual regulation management on the phone. I rec'd that she schedule an appt for this.

## 2018-05-12 ENCOUNTER — Other Ambulatory Visit: Payer: Self-pay | Admitting: Physician Assistant

## 2018-05-12 MED ORDER — FLUCONAZOLE 150 MG PO TABS: 150.0000 mg | ORAL_TABLET | Freq: Once | ORAL | 0 refills | Status: AC

## 2018-06-05 ENCOUNTER — Ambulatory Visit (INDEPENDENT_AMBULATORY_CARE_PROVIDER_SITE_OTHER): Payer: BC Managed Care – PPO | Admitting: Physician Assistant

## 2018-06-05 DIAGNOSIS — Z23 Encounter for immunization: Secondary | ICD-10-CM | POA: Diagnosis not present

## 2018-06-06 NOTE — Progress Notes (Signed)
..  Diagnoses and all orders for this visit:  Need for immunization against influenza -     Flu Vaccine QUAD 36+ mos IM

## 2019-02-04 ENCOUNTER — Other Ambulatory Visit: Payer: Self-pay | Admitting: Physician Assistant

## 2019-02-08 ENCOUNTER — Encounter: Payer: Self-pay | Admitting: Physician Assistant

## 2019-02-08 ENCOUNTER — Ambulatory Visit (INDEPENDENT_AMBULATORY_CARE_PROVIDER_SITE_OTHER): Payer: BC Managed Care – PPO | Admitting: Physician Assistant

## 2019-02-08 VITALS — Ht 66.25 in | Wt 210.0 lb

## 2019-02-08 DIAGNOSIS — F329 Major depressive disorder, single episode, unspecified: Secondary | ICD-10-CM | POA: Diagnosis not present

## 2019-02-08 DIAGNOSIS — F32A Depression, unspecified: Secondary | ICD-10-CM

## 2019-02-08 DIAGNOSIS — F419 Anxiety disorder, unspecified: Secondary | ICD-10-CM

## 2019-02-08 MED ORDER — CLONAZEPAM 0.5 MG PO TABS: 0.5000 mg | ORAL_TABLET | Freq: Two times a day (BID) | ORAL | 2 refills | Status: AC | PRN

## 2019-02-08 NOTE — Progress Notes (Signed)
Patient ID: Patricia Robertson, female   DOB: [birth year], 43 y.o.   MRN: [id number] .Marland KitchenVirtual Visit via Video Note  I connected with Patricia Robertson on 02/08/19 at 11:30 AM EDT by a video enabled telemedicine application and verified that I am speaking with the correct person using two identifiers.  Location: Patient: home Provider: clinic   I discussed the limitations of evaluation and management by telemedicine and the availability of in person appointments. The patient expressed understanding and agreed to proceed.  History of Present Illness: Pt is a 43 yo female with hx of anxiety and depression who calls into the clinic to get klonapin refilled. She rarely uses it but has been using a little more and when she went to get it refilled it did not have any. Last refill was 09/09/17. She does feel like COVId pandemic has causes some extra anxiety. She is seeing a therapist every 2 weeks. No SI/HC.   Marland Kitchen. Active Ambulatory Problems    Diagnosis Date Noted  . Anxiety and depression 11/06/2012  . Migraine headache 05/25/2013  . Neck muscle spasm 05/25/2013  . 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 Left tibial fracture 11/06/2012  . Fracture of radial head, left, closed 03/04/2014   Past Medical History:  Diagnosis Date  . Anxiety   . Elbow fracture   . Knee fracture   . Obesity      .Marland Kitchen Active Ambulatory Problems    Diagnosis Date Noted  . Anxiety and depression 11/06/2012  . Migraine headache 05/25/2013  . Neck muscle spasm 05/25/2013  . 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 Left tibial fracture 11/06/2012  . Fracture of radial head, left, closed 03/04/2014   Past Medical History:  Diagnosis Date  . Anxiety   . Elbow fracture   . Knee fracture   . Obesity    Reviewed med, allergy, problem list.     Observations/Objective: No  acute distress. Normal breathing.  Normal mood.   .. Today's Vitals   02/08/19 1045  Weight: 210 lb (95.3 kg)  Height: 5' 6.25" (1.683 m)   Body mass index is 33.64 kg/m.   .. Depression screen [facility] 2/9 02/08/2019  Decreased Interest 0  Down, Depressed, Hopeless 1  PHQ - 2 Score 1  Altered sleeping 0  Tired, decreased energy 0  Change in appetite 1  Feeling bad or failure about yourself  1  Trouble concentrating 1  Moving slowly or fidgety/restless 0  Suicidal thoughts 0  PHQ-9 Score 4  Difficult doing work/chores Somewhat difficult   .Marland Kitchen GAD 7 : Generalized Anxiety Score 02/08/2019  Nervous, Anxious, on Edge 1  Control/stop worrying 1  Worry too much - different things 1  Trouble relaxing 1  Restless 0  Easily annoyed or irritable 1  Afraid - awful might happen 2  Total GAD 7 Score 7  Anxiety Difficulty Somewhat difficult     Assessment and Plan: Marland KitchenMarland KitchenLanee was seen today for anxiety.  Diagnoses and all orders for this visit:  Anxiety and depression  Other orders -     clonazePAM (KLONOPIN) 0.5 MG tablet; Take 1 tablet (0.5 mg total) by mouth 2 (two) times daily as needed for anxiety.   Continue with therapist. Try to stay active. Refilled klonapin. If noticing using every day or very regularly then consider follow up to discussed a  different medication approach.   Follow Up Instructions:    I discussed the assessment and treatment plan with the patient. The patient was provided an opportunity to ask questions and all were answered. The patient agreed with the plan and demonstrated an understanding of the instructions.   The patient was advised to call back or seek an in-person evaluation if the symptoms worsen or if the condition fails to improve as anticipated.     Tandy GawJade , PA-C

## 2019-02-08 NOTE — Progress Notes (Signed)
Patient asking for refill of Clonazepam. She rarely uses. She never got her last refill, but when she went to fill it was expired. PHQ9-GAD7 completed.

## 2019-03-06 ENCOUNTER — Ambulatory Visit (INDEPENDENT_AMBULATORY_CARE_PROVIDER_SITE_OTHER): Payer: BC Managed Care – PPO | Admitting: Physician Assistant

## 2019-03-06 VITALS — Ht 66.25 in | Wt 211.0 lb

## 2019-03-06 DIAGNOSIS — Z20822 Contact with and (suspected) exposure to covid-19: Secondary | ICD-10-CM

## 2019-03-06 DIAGNOSIS — Z20828 Contact with and (suspected) exposure to other viral communicable diseases: Secondary | ICD-10-CM

## 2019-03-06 NOTE — Progress Notes (Deleted)
Husband Tuesday last week helped his grandmother  Saturday the grandmother was diagnosed with Covid He was wearing mask/gloves and has no symptoms His doctor is having him quarantine for 10 days anyway She wants to know if she should also quarantine since she has come in contact with her husband even though she didn't have contact with grandmother directly

## 2019-03-07 ENCOUNTER — Encounter: Payer: Self-pay | Admitting: Physician Assistant

## 2019-03-07 NOTE — Progress Notes (Signed)
Patient ID: Philamena Kramar, female   DOB: July 03, 1976, 43 y.o.   MRN: [id number] .Marland KitchenVirtual Visit via Video Note  I connected with Demetrios Isaacs on 03/07/19 at  8:10 AM EDT by a video enabled telemedicine application and verified that I am speaking with the correct person using two identifiers.  Location: Patient: home Provider: clinic   I discussed the limitations of evaluation and management by telemedicine and the availability of in person appointments. The patient expressed understanding and agreed to proceed.  History of Present Illness: Patient is a 43 year old female who comes into the clinic to discuss potential COVID exposure.  Last Tuesday her husband was helping his grandmother around the house.  He was wearing a mask and gloves while working at her house.  2 days later she tested positive for COVID.  Her husband's provider had him quarantine for 10 days despite him having no symptoms.  She is concerned because of her direct exposure with her husband if she should quarantine as well.  She denies any cough,fever,  shortness of breath, headache, congestion, sore throat, loss of sense of smell or taste, GI symptoms.  .. Active Ambulatory Problems    Diagnosis Date Noted  . Anxiety and depression 11/06/2012  . Migraine headache 05/25/2013  . Neck muscle spasm 05/25/2013  . 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 Left tibial fracture 11/06/2012  . Fracture of radial head, left, closed 03/04/2014   Past Medical History:  Diagnosis Date  . Anxiety   . Elbow fracture   . Knee fracture   . Obesity    Reviewed med, allergy, and problem list.    Observations/Objective: No acute distress. No labored breathing.  Normal mood.   .. Today's Vitals   03/06/19 0758  Weight: 211 lb (95.7 kg)  Height: 5' 6.25" (1.683 m)   Body mass index is 33.8 kg/m.   Assessment and Plan: Marland KitchenMarland KitchenDiagnoses and all orders for  this visit:  Exposure to Covid-19 Virus  After traveling patient it does seem she has been quarantined with her husband since finding out the news of his exposure to the grandmother who was positive for COVID.  I did recommend her to continue the 10-day post exposure quarantine with her husband.  If they were to have any symptoms to go get tested. Discussed testing centers for [facility].     Follow Up Instructions:    I discussed the assessment and treatment plan with the patient. The patient was provided an opportunity to ask questions and all were answered. The patient agreed with the plan and demonstrated an understanding of the instructions.   The patient was advised to call back or seek an in-person evaluation if the symptoms worsen or if the condition fails to improve as anticipated.    Iran Planas, PA-C

## 2019-04-24 ENCOUNTER — Telehealth: Payer: Self-pay | Admitting: Physician Assistant

## 2019-04-24 NOTE — Telephone Encounter (Signed)
Please advise if this is appropriate.

## 2019-04-24 NOTE — Telephone Encounter (Signed)
Patient states the risks are as follows:  1. "I just don't want to get COVID"  2. She is obese- and she feels that puts her at higher risk   3. Her family and husband are [place] American and she read they are the most likely to get COVID  4. Her husband is also obese- so she feels he is at very high risk

## 2019-04-24 NOTE — Telephone Encounter (Signed)
error 

## 2019-04-24 NOTE — Telephone Encounter (Signed)
What is her comorbidity?  I do not see anything on her problem list that would increase her risk and I do not see any medication such as immunosuppressants on her med list.

## 2019-04-24 NOTE — Telephone Encounter (Signed)
Pt called. She wants a letter stating that she needs to continue working from home due to her comorbidity. Thanks

## 2019-04-25 ENCOUNTER — Encounter: Payer: Self-pay | Admitting: Physician Assistant

## 2019-04-25 NOTE — Telephone Encounter (Signed)
Okay to provide letter saying that she is at higher risk for contracting COVID and that the current recommendation is for her to be able to work at home if they are able to provide this for her.  Thank you.

## 2019-04-25 NOTE — Telephone Encounter (Signed)
Letter provided for patient. She is aware and letter is up front to be picked up

## 2019-04-26 ENCOUNTER — Encounter: Payer: Self-pay | Admitting: Physician Assistant

## 2019-05-03 NOTE — Telephone Encounter (Signed)
Paperwork completed. Called and advised patient. Paperwork mailed per pt request and copy sent to scan

## 2019-05-21 ENCOUNTER — Encounter: Payer: Self-pay | Admitting: Physician Assistant

## 2019-05-22 ENCOUNTER — Encounter: Payer: Self-pay | Admitting: Physician Assistant

## 2019-05-22 NOTE — Telephone Encounter (Signed)
Patient also left two voicemails concerning this. Please advise.

## 2019-05-25 ENCOUNTER — Ambulatory Visit: Payer: BC Managed Care – PPO | Admitting: Physician Assistant

## 2019-05-25 ENCOUNTER — Encounter: Payer: Self-pay | Admitting: Physician Assistant

## 2019-05-25 ENCOUNTER — Ambulatory Visit (INDEPENDENT_AMBULATORY_CARE_PROVIDER_SITE_OTHER): Payer: BC Managed Care – PPO | Admitting: Physician Assistant

## 2019-05-25 VITALS — Ht 66.25 in | Wt 219.0 lb

## 2019-05-25 DIAGNOSIS — Z6835 Body mass index (BMI) 35.0-35.9, adult: Secondary | ICD-10-CM

## 2019-05-25 DIAGNOSIS — F419 Anxiety disorder, unspecified: Secondary | ICD-10-CM

## 2019-05-25 DIAGNOSIS — F430 Acute stress reaction: Secondary | ICD-10-CM | POA: Diagnosis not present

## 2019-05-25 DIAGNOSIS — E6609 Other obesity due to excess calories: Secondary | ICD-10-CM | POA: Diagnosis not present

## 2019-05-25 DIAGNOSIS — F418 Other specified anxiety disorders: Secondary | ICD-10-CM | POA: Diagnosis not present

## 2019-05-25 DIAGNOSIS — F329 Major depressive disorder, single episode, unspecified: Secondary | ICD-10-CM

## 2019-05-25 NOTE — Progress Notes (Signed)
l °

## 2019-05-25 NOTE — Progress Notes (Signed)
Patient ID: Patricia Robertson, female   DOB: January 28, 1976, 43 y.o.   MRN: [id number]  .Marland KitchenVirtual Visit via Video Note  I connected with Demetrios Isaacs on 05/28/19 at 11:30 AM EST by a video enabled telemedicine application and verified that I am speaking with the correct person using two identifiers.  Location: Patient: home Provider: clinic   I discussed the limitations of evaluation and management by telemedicine and the availability of in person appointments. The patient expressed understanding and agreed to proceed.  History of Present Illness: Pt is a 43 yo female with anxiety and depression who calls into the clinic with acute stress and anxiety. 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 She is obese and increases her risk of complications. She is very fearful about COVID and the increase in cases. Her job can be done remotely. She is having trouble sleeping. She finds herself crying more thinking about going into work and getting covid. She goes over and over good clean techniques to keep her family safe. She is taking more klonapin. If she had to go into work she suspects she would not be able to concentrate. No SI/HC.   Marland Kitchen. Active Ambulatory Problems    Diagnosis Date Noted  . Anxiety and depression 11/06/2012  . Migraine headache 05/25/2013  . Neck muscle spasm 05/25/2013  . 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 Class 2 obesity due to excess calories without serious comorbidity with body mass index (BMI) of 35.0 to 35.9 in adult 05/28/2019  . Anxiety about health 05/28/2019  . Stress reaction 05/28/2019   Resolved Ambulatory Problems    Diagnosis Date Noted  . Left tibial fracture 11/06/2012  . Fracture of radial head, left, closed 03/04/2014   Past Medical History:  Diagnosis Date  . Anxiety   . Elbow fracture   . Knee fracture   . Obesity    REviewed med, allergy, problem list.    Observations/Objective: No acute  distress. Anxious.   .. Today's Vitals   05/25/19 1029  Weight: 219 lb (99.3 kg)  Height: 5' 6.25" (1.683 m)   Body mass index is 35.08 kg/m.  .. Depression screen [facility] 2/9 05/28/2019 02/08/2019  Decreased Interest 2 0  Down, Depressed, Hopeless 2 1  PHQ - 2 Score 4 1  Altered sleeping 2 0  Tired, decreased energy 2 0  Change in appetite 0 1  Feeling bad or failure about yourself  0 1  Trouble concentrating 1 1  Moving slowly or fidgety/restless 0 0  Suicidal thoughts 0 0  PHQ-9 Score 9 4  Difficult doing work/chores Somewhat difficult Somewhat difficult   .Marland Kitchen GAD 7 : Generalized Anxiety Score 05/28/2019 02/08/2019  Nervous, Anxious, on Edge 3 1  Control/stop worrying 3 1  Worry too much - different things 3 1  Trouble relaxing 3 1  Restless 3 0  Easily annoyed or irritable 3 1  Afraid - awful might happen 3 2  Total GAD 7 Score 21 7  Anxiety Difficulty Very difficult Somewhat difficult       Assessment and Plan: Marland KitchenMarland KitchenShanan was seen today for advice only.  Diagnoses and all orders for this visit:  Anxiety about health  Class 2 obesity due to excess calories without serious comorbidity with body mass index (BMI) of 35.0 to 35.9 in adult  Anxiety and depression  Stress reaction   Pt is extremely anxious about covid and the current rise in cases and  what her job is doing to protect others. She has one increased risk of complications.She is having numerous issues with anxiety and cannot function in the work environment. A letter written for at home accommodations was written today to extended until cases decrease or a better work protocol is reached to keep herself safer. I do think her working from home could help keep her anxiety controlled as pandemic is maneuvered. Reminder to only use klonapin sparingly. Discussed using SSRI. Pt declined right now. Follow up in 2 months for reassessment.    Follow Up Instructions:    I discussed the assessment and treatment  plan with the patient. The patient was provided an opportunity to ask questions and all were answered. The patient agreed with the plan and demonstrated an understanding of the instructions.   The patient was advised to call back or seek an in-person evaluation if the symptoms worsen or if the condition fails to improve as anticipated.   Tandy Gaw, PA-C

## 2019-05-28 ENCOUNTER — Encounter: Payer: Self-pay | Admitting: Physician Assistant

## 2019-05-28 DIAGNOSIS — F418 Other specified anxiety disorders: Secondary | ICD-10-CM | POA: Insufficient documentation

## 2019-05-28 DIAGNOSIS — E6609 Other obesity due to excess calories: Secondary | ICD-10-CM | POA: Insufficient documentation

## 2019-05-28 DIAGNOSIS — F430 Acute stress reaction: Secondary | ICD-10-CM | POA: Insufficient documentation

## 2019-05-28 NOTE — Telephone Encounter (Signed)
Patricia Robertson - do you still have forms that were emailed to patient to resend?

## 2019-05-29 ENCOUNTER — Encounter: Payer: Self-pay | Admitting: Physician Assistant

## 2019-05-29 NOTE — Telephone Encounter (Signed)
Do you know where these papers are now.

## 2019-05-29 NOTE — Telephone Encounter (Signed)
Patient called back and wants to know if you would need to addend anything on the second page since the top box was not filled out for her. Please advise if any changes are needed.

## 2019-06-07 ENCOUNTER — Encounter: Payer: Self-pay | Admitting: Physician Assistant

## 2019-06-11 ENCOUNTER — Encounter: Payer: Self-pay | Admitting: Physician Assistant

## 2019-06-11 ENCOUNTER — Ambulatory Visit (INDEPENDENT_AMBULATORY_CARE_PROVIDER_SITE_OTHER): Payer: BC Managed Care – PPO | Admitting: Physician Assistant

## 2019-06-11 VITALS — Ht 66.25 in | Wt 219.0 lb

## 2019-06-11 DIAGNOSIS — F329 Major depressive disorder, single episode, unspecified: Secondary | ICD-10-CM

## 2019-06-11 DIAGNOSIS — Z6835 Body mass index (BMI) 35.0-35.9, adult: Secondary | ICD-10-CM

## 2019-06-11 DIAGNOSIS — F418 Other specified anxiety disorders: Secondary | ICD-10-CM | POA: Diagnosis not present

## 2019-06-11 DIAGNOSIS — F430 Acute stress reaction: Secondary | ICD-10-CM

## 2019-06-11 DIAGNOSIS — F419 Anxiety disorder, unspecified: Secondary | ICD-10-CM

## 2019-06-11 DIAGNOSIS — E6609 Other obesity due to excess calories: Secondary | ICD-10-CM | POA: Diagnosis not present

## 2019-06-11 NOTE — Progress Notes (Signed)
Patient ID: Patricia Robertson, female   DOB: [birth year], 43 y.o.   MRN: [id number] .Marland KitchenVirtual Visit via Video Note  I connected with Patricia Robertson on 06/11/19 at  9:10 AM EST by a video enabled telemedicine application and verified that I am speaking with the correct person using two identifiers.  Location: Patient: home Provider: clinic   I discussed the limitations of evaluation and management by telemedicine and the availability of in person appointments. The patient expressed understanding and agreed to proceed.  History of Present Illness: Patient is a 43 year old female with intermittent anxiety and depression who calls in today to discuss increased stress and anxiety around Covid pandemic.  She has been trying to get a work accommodation to work from home during the [employer] pandemic at least while numbers are elevated.  We have tried twice to send letters based off her 1 risk factor of obesity and her anxiety and depression.  Both of been denied.  She honestly states this is the worst her anxiety and depression have been since her divorce.  She has a lot of problems sleeping and she tends to make bad choices eating.  She is very tearful and just worried overall about her safety and the safety of others.  She wonders if she needs to go back on a daily anxiety and depression medicine.  She feels like her depression is also coming back.  She meets with a therapist virtually once a week.  She denies any suicidal or homicidal thoughts.  She is currently working through some struggles in her relationship with her daughter which also is a major stressor.  .   .. Active Ambulatory Problems    Diagnosis Date Noted  . Anxiety and depression 11/06/2012  . Migraine headache 05/25/2013  . Neck muscle spasm 05/25/2013  . 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 Class 2 obesity due to excess calories without serious comorbidity with body mass index (BMI) of 35.0 to 35.9 in adult 05/28/2019  .  Anxiety about health 05/28/2019  . Stress reaction 05/28/2019   Resolved Ambulatory Problems    Diagnosis Date Noted  . Left tibial fracture 11/06/2012  . Fracture of radial head, left, closed 03/04/2014   Past Medical History:  Diagnosis Date  . Anxiety   . Elbow fracture   . Knee fracture   . Obesity    REviewed med, allergy, problem list.     Observations/Objective: No acute distress.  Tearful and emotional.  No coughing.  Normal appearance.   . Today's Vitals   06/11/19 0835  Weight: 219 lb (99.3 kg)  Height: 5' 6.25" (1.683 m)   Body mass index is 35.08 kg/m.   Assessment and Plan: Marland KitchenMarland KitchenSharece was seen today for follow-up.  Diagnoses and all orders for this visit:  Anxiety about health  Anxiety and depression  Stress reaction  Class 2 obesity due to excess calories without serious comorbidity with body mass index (BMI) of 35.0 to 35.9 in adult   Explained to patient even though she has the risk factor of obesity is still places her in low risk of overall complications from Covid.  Patient described to me the safety precautions taking at work and I feel like these are strenuous and should help to keep her safe.  I discussed something she could do on her own to help keep her safer.  Reassured her that overall her odds are really safe of staying safe from Covid however not 100%.  I do think patient  has a lot going on.  She does have the Klonopin which helps.  We discussed medication but she hates coming off the medication and how she feels.  Also discussed how would like for her if we start medication daily to stay on this for at least 6 months.  For now we will continue with therapy and exercise/meditation.  Perhaps when she gets into the working environment she will see how safe she is.  Follow-up as needed.  4 weeks follow up if not improving.   Follow Up Instructions:    I discussed the assessment and treatment plan with the patient. The patient was  provided an opportunity to ask questions and all were answered. The patient agreed with the plan and demonstrated an understanding of the instructions.   The patient was advised to call back or seek an in-person evaluation if the symptoms worsen or if the condition fails to improve as anticipated.    Iran Planas, PA-C

## 2019-06-11 NOTE — Telephone Encounter (Signed)
I called her back

## 2019-06-22 ENCOUNTER — Encounter: Payer: Self-pay | Admitting: Physician Assistant

## 2019-06-25 ENCOUNTER — Encounter: Payer: Self-pay | Admitting: Physician Assistant

## 2019-06-25 NOTE — Telephone Encounter (Signed)
I printed paperwork and placed in your box, unsure how to complete these for her.

## 2019-06-25 NOTE — Telephone Encounter (Signed)
Can we print off her ADA form for me to make new suggestion.

## 2019-06-26 NOTE — Telephone Encounter (Signed)
Will you add patient to 11:30 to finish filling out FMLA.

## 2019-06-27 ENCOUNTER — Encounter: Payer: BC Managed Care – PPO | Admitting: Physician Assistant

## 2019-06-29 ENCOUNTER — Ambulatory Visit (INDEPENDENT_AMBULATORY_CARE_PROVIDER_SITE_OTHER): Payer: BC Managed Care – PPO | Admitting: Physician Assistant

## 2019-06-29 ENCOUNTER — Telehealth: Payer: Self-pay | Admitting: Neurology

## 2019-06-29 VITALS — Ht 66.25 in | Wt 219.0 lb

## 2019-06-29 DIAGNOSIS — R4589 Other symptoms and signs involving emotional state: Secondary | ICD-10-CM

## 2019-06-29 DIAGNOSIS — F419 Anxiety disorder, unspecified: Secondary | ICD-10-CM

## 2019-06-29 DIAGNOSIS — F32A Depression, unspecified: Secondary | ICD-10-CM

## 2019-06-29 DIAGNOSIS — F329 Major depressive disorder, single episode, unspecified: Secondary | ICD-10-CM | POA: Diagnosis not present

## 2019-06-29 DIAGNOSIS — F418 Other specified anxiety disorders: Secondary | ICD-10-CM | POA: Diagnosis not present

## 2019-06-29 NOTE — Progress Notes (Signed)
Discuss FMLA paperwork

## 2019-06-29 NOTE — Progress Notes (Signed)
..Virtual Visit via Video Note  I connected with Patricia Robertson on 06/29/19 at  1:00 PM EST by a video enabled telemedicine application and verified that I am speaking with the correct person using two identifiers.  Location: Patient: home Provider: clinic   I discussed the limitations of evaluation and management by telemedicine and the availability of in person appointments. The patient expressed understanding and agreed to proceed.  History of Present Illness: Patient is a 43 year old obese female with a history of anxiety and depression who presents to the clinic to discuss worsening anxiety.  Since the Covid pandemic in March of this year she has had worsening anxiety and depression.  She has been seeing a counselor every 2 weeks to help with this.  Most of her anxiety is surrounded by her health and the Covid.  She is out of increased risk with her obesity.  She does not feel safe at her current workstation.  This increase stressed when she has to be in the office causes her not to be able to effectively work and finish task.  Her anxiety has caused her to lose sleep about going into work the next day when she feels she is not safe.  She would like the permission to be able to miss certain days a month when she is asked to do things that will exacerbate her anxiety and cause her not to be able to effectively do her job.  .. Active Ambulatory Problems    Diagnosis Date Noted  . Anxiety and depression 11/06/2012  . Migraine headache 05/25/2013  . Neck muscle spasm 05/25/2013  . 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 Class 2 obesity due to excess calories without serious comorbidity with body mass index (BMI) of 35.0 to 35.9 in adult 05/28/2019  . Anxiety about health 05/28/2019  . Stress reaction 05/28/2019   Resolved Ambulatory Problems    Diagnosis Date Noted  . Left tibial fracture 11/06/2012  . Fracture of radial head, left, closed 03/04/2014   Past Medical  History:  Diagnosis Date  . Anxiety   . Elbow fracture   . Knee fracture   . Obesity    Reviewed med, allergy, problem list.    Observations/Objective: No acute distress. Normal appearance and mood.   .. Today's Vitals   06/29/19 1115  Weight: 219 lb (99.3 kg)  Height: 5' 6.25" (1.683 m)   Body mass index is 35.08 kg/m. .. Depression screen [facility] 2/9 05/28/2019 02/08/2019  Decreased Interest 2 0  Down, Depressed, Hopeless 2 1  PHQ - 2 Score 4 1  Altered sleeping 2 0  Tired, decreased energy 2 0  Change in appetite 0 1  Feeling bad or failure about yourself  0 1  Trouble concentrating 1 1  Moving slowly or fidgety/restless 0 0  Suicidal thoughts 0 0  PHQ-9 Score 9 4  Difficult doing work/chores Somewhat difficult Somewhat difficult   .Marland Kitchen GAD 7 : Generalized Anxiety Score 05/28/2019 02/08/2019  Nervous, Anxious, on Edge 3 1  Control/stop worrying 3 1  Worry too much - different things 3 1  Trouble relaxing 3 1  Restless 3 0  Easily annoyed or irritable 3 1  Afraid - awful might happen 3 2  Total GAD 7 Score 21 7  Anxiety Difficulty Very difficult Somewhat difficult       Assessment and Plan: .Arlett was seen today for advice only.  Diagnoses and all orders for this visit:  Anxiety and depression  Anxiety about health   FMLA filled out for intermittent absences 8-10 times a month 1 to 2 days per episode for anxiety due to Covid and/or situations and circumstances that patient is in and that triggers her anxiety to worsen.  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 When under stress due to her safety she is not able to concentrate and complete task and makes decision as she should.    Follow Up Instructions:    I discussed the assessment and treatment plan with the patient. The patient was provided an opportunity to ask questions and all were answered. The  patient agreed with the plan and demonstrated an understanding of the instructions.   The patient was advised to call back or seek an in-person evaluation if the symptoms worsen or if the condition fails to improve as anticipated.   Tandy Gaw, PA-C

## 2019-06-29 NOTE — Telephone Encounter (Signed)
Added to schedule.

## 2019-06-29 NOTE — Telephone Encounter (Signed)
FMLA and ADA forms completed. Copy to scan. Copy kept on file. Copy given to front desk to email to patient.

## 2019-09-08 ENCOUNTER — Encounter: Payer: Self-pay | Admitting: Physician Assistant

## 2019-11-12 ENCOUNTER — Other Ambulatory Visit: Payer: Self-pay

## 2019-11-12 ENCOUNTER — Other Ambulatory Visit (HOSPITAL_COMMUNITY)
Admission: RE | Admit: 2019-11-12 | Discharge: 2019-11-12 | Disposition: A | Discharge status: Home / Self Care | Payer: BC Managed Care – PPO | Source: Ambulatory Visit | Attending: Obstetrics & Gynecology | Admitting: Obstetrics & Gynecology

## 2019-11-12 ENCOUNTER — Ambulatory Visit: Payer: BC Managed Care – PPO | Admitting: Obstetrics & Gynecology

## 2019-11-12 ENCOUNTER — Encounter: Payer: Self-pay | Admitting: Obstetrics & Gynecology

## 2019-11-12 VITALS — BP 133/81 | HR 59 | Temp 98.4°F | Wt 224.0 lb

## 2019-11-12 DIAGNOSIS — Z01419 Encounter for gynecological examination (general) (routine) without abnormal findings: Secondary | ICD-10-CM

## 2019-11-12 DIAGNOSIS — Z01812 Encounter for preprocedural laboratory examination: Secondary | ICD-10-CM

## 2019-11-12 DIAGNOSIS — Z30433 Encounter for removal and reinsertion of intrauterine contraceptive device: Secondary | ICD-10-CM

## 2019-11-12 LAB — POCT URINE PREGNANCY: Preg Test, Ur: NEGATIVE

## 2019-11-12 MED ORDER — LEVONORGESTREL 20 MCG/24HR IU IUD: INTRAUTERINE_SYSTEM | Freq: Once | INTRAUTERINE | Status: AC

## 2019-11-12 NOTE — Progress Notes (Signed)
Subjective:     Patricia Robertson is a 44 y.o. female here for a routine exam.  Current complaints: wants IUD removed and replaced.  Has done very well with Mirena.     Gynecologic History Patient's last menstrual period was 10/22/2019. Contraception: IUD and vasectomy Last Pap: 2016. Results were: normal Last mammogram: 2015. Results were: normal  Obstetric History OB History  Gravida Para Term Preterm AB Living  1 1 1     1   SAB TAB Ectopic Multiple Live Births          1    # Outcome Date GA Lbr Len/2nd Weight Sex Delivery Anes PTL Lv  1 Term 03/06/01 [redacted]w[redacted]d  6 lb 3 oz (2.807 kg) F Vag-Spont None N LIV     The following portions of the patient's history were reviewed and updated as appropriate: allergies, current medications, past family history, past medical history, past social history, past surgical history and problem list.  Review of Systems Pertinent items noted in HPI and remainder of comprehensive ROS otherwise negative.    Objective:      Vitals:   11/12/19 1101  BP: 133/81  Pulse: (!) 59  Temp: 98.4 F (36.9 C)  Weight: 224 lb (101.6 kg)   Vitals:  WNL General appearance: alert, cooperative and no distress  HEENT: Normocephalic, without obvious abnormality, atraumatic Eyes: negative Throat: lips, mucosa, and tongue normal; teeth and gums normal  Respiratory: Clear to auscultation bilaterally  CV: Regular rate and rhythm  Breasts:  Normal appearance, no masses or tenderness, no nipple retraction or dimpling  GI: Soft, non-tender; bowel sounds normal; no masses,  no organomegaly  GU: External Genitalia:  Tanner V, no lesion Urethra:  No prolapse   Vagina: Pink, normal rugae, no blood or discharge  Cervix: No CMT, no lesion, IUD strings seen  Uterus:  Normal size and contour, non tender  Adnexa: Normal, no masses, non tender  Musculoskeletal: No edema, redness or tenderness in the calves or thighs  Skin: No lesions or rash  Lymphatic: Axillary adenopathy:  none     Psychiatric: Normal mood and behavior        Assessment:    Healthy female exam.    Plan:   1.  Pap with cotesting 2.  Mammogram 3.  IUD removal and insertion  GYNECOLOGY CLINIC PROCEDURE NOTE   IUD Removal and Reinsertion  Patient identified, informed consent performed. Discussed risks of irregular bleeding, cramping, infection, malpositioning or misplacement of the IUD outside the uterus which may require further procedures. Time out was performed. Speculum placed in the vagina. Cervix visualized. Cleaned with Betadine x 2. Grasped anteriorly with a single tooth tenaculum. The strings of the IUD were grasped and pulled using ring forceps. The IUD was successfully removed in its entirety. Uterus sounded to 7 cm. Mirena IUD placed per manufacturer's recommendations. Strings trimmed to 3 cm. Tenaculum was removed, good hemostasis noted. Patient tolerated procedure well. Patient was given post-procedure instructions.  Patient was also asked to check IUD strings periodically and follow up in 6 weeks for IUD check.

## 2019-11-13 LAB — CYTOLOGY - PAP
Comment: NEGATIVE
Diagnosis: NEGATIVE
High risk HPV: NEGATIVE

## 2019-11-29 ENCOUNTER — Ambulatory Visit (INDEPENDENT_AMBULATORY_CARE_PROVIDER_SITE_OTHER): Payer: BC Managed Care – PPO

## 2019-11-29 ENCOUNTER — Other Ambulatory Visit: Payer: Self-pay

## 2019-11-29 DIAGNOSIS — Z01419 Encounter for gynecological examination (general) (routine) without abnormal findings: Secondary | ICD-10-CM | POA: Diagnosis not present

## 2019-11-30 ENCOUNTER — Encounter: Payer: Self-pay | Admitting: Physician Assistant

## 2019-11-30 ENCOUNTER — Other Ambulatory Visit: Payer: Self-pay | Admitting: Obstetrics & Gynecology

## 2019-11-30 DIAGNOSIS — E785 Hyperlipidemia, unspecified: Secondary | ICD-10-CM

## 2019-11-30 DIAGNOSIS — Z6835 Body mass index (BMI) 35.0-35.9, adult: Secondary | ICD-10-CM

## 2019-11-30 DIAGNOSIS — R928 Other abnormal and inconclusive findings on diagnostic imaging of breast: Secondary | ICD-10-CM

## 2019-12-18 ENCOUNTER — Ambulatory Visit
Admission: RE | Admit: 2019-12-18 | Discharge: 2019-12-18 | Disposition: A | Discharge status: Home / Self Care | Payer: BC Managed Care – PPO | Source: Ambulatory Visit | Attending: Obstetrics & Gynecology | Admitting: Obstetrics & Gynecology

## 2019-12-18 ENCOUNTER — Other Ambulatory Visit: Payer: Self-pay

## 2019-12-18 DIAGNOSIS — R928 Other abnormal and inconclusive findings on diagnostic imaging of breast: Secondary | ICD-10-CM

## 2019-12-24 ENCOUNTER — Encounter: Payer: Self-pay | Admitting: Obstetrics & Gynecology

## 2019-12-24 ENCOUNTER — Ambulatory Visit (INDEPENDENT_AMBULATORY_CARE_PROVIDER_SITE_OTHER): Payer: BC Managed Care – PPO | Admitting: Obstetrics & Gynecology

## 2019-12-24 ENCOUNTER — Other Ambulatory Visit: Payer: Self-pay

## 2019-12-24 VITALS — BP 115/74 | HR 61 | Resp 16 | Ht 62.5 in | Wt 222.0 lb

## 2019-12-24 DIAGNOSIS — Z30431 Encounter for routine checking of intrauterine contraceptive device: Secondary | ICD-10-CM | POA: Diagnosis not present

## 2019-12-24 NOTE — Progress Notes (Signed)
   Subjective:    Patient ID: Patricia Robertson, female    DOB: 11-23-1975, 44 y.o.   MRN: [id number]  HPI  44 yo female presents for IUD check up.  IUD removed and new one inserted.  Pt had bleeding after the insertion which stopped and then she spotted a month later.  None today.  Pt had Mammogram and follow up images.  REsults on chart.   Review of Systems  Constitutional: Negative.   Respiratory: Negative.   Cardiovascular: Negative.   Gastrointestinal: Negative.   Genitourinary: Positive for menstrual problem and vaginal bleeding. Negative for pelvic pain.       Objective:   Physical Exam Vitals reviewed.  Constitutional:      General: She is not in acute distress.    Appearance: She is well-developed.  HENT:     Head: Normocephalic and atraumatic.  Eyes:     Conjunctiva/sclera: Conjunctivae normal.  Cardiovascular:     Rate and Rhythm: Normal rate.  Pulmonary:     Effort: Pulmonary effort is normal.  Genitourinary:    Comments: Tanner V Vulva:  No lesion Vagina:  Pink, no lesions, no discharge, no blood Cervix:  No CMT, strings at 2 cm.    Skin:    General: Skin is warm and dry.  Neurological:     Mental Status: She is alert and oriented to person, place, and time.       Assessment & Plan:  44 yo female presents for IUD string check and review of mammogram results.  1.  IUD in correct position.  Discussed bleeding normal when IUD exchanged.  If bleeding reoccurs, can try trial of provera. 2.  Mammogram reviewed.  Follow up views and [place]. Birads 1.  Pt will have f/u exams at [facility] so prior images are available.    20 mins spent with patient during exam and documentation.

## 2019-12-25 ENCOUNTER — Other Ambulatory Visit: Payer: Self-pay

## 2019-12-25 DIAGNOSIS — D582 Other hemoglobinopathies: Secondary | ICD-10-CM

## 2019-12-25 NOTE — Telephone Encounter (Signed)
Drop the MVI with iron. Lets see if hemoglobin goes back into normal range.

## 2019-12-25 NOTE — Telephone Encounter (Signed)
Malaney,   Hemoglobin a little elevated and blood a little thick. Lets monitor this and recheck in 1 month. You are not taking any iron are you?   Fasting glucose a little elevated but A1C still normal. Continue to watch sugars and carbs.   Cholesterol is good for age and risk factors but could be better. Watch processed foods/food high in fats and try to get 150 minutes of exercise a week.   Kidney and liver look great!

## 2019-12-28 ENCOUNTER — Encounter: Payer: Self-pay | Admitting: Physician Assistant

## 2019-12-28 NOTE — Telephone Encounter (Signed)
TSH added via [facility] portal Order number: [id number].

## 2019-12-28 NOTE — Telephone Encounter (Signed)
Can we see if we can add TSH to labs?

## 2019-12-29 LAB — COMPLETE METABOLIC PANEL WITH GFR
AG Ratio: 1.8 (calc) (ref 1.0–2.5)
ALT: 18 U/L (ref 6–29)
AST: 15 U/L (ref 10–30)
Albumin: 4.3 g/dL (ref 3.6–5.1)
Alkaline phosphatase (APISO): 69 U/L (ref 31–125)
BUN: 18 mg/dL (ref 7–25)
CO2: 26 mmol/L (ref 20–32)
Calcium: 9.4 mg/dL (ref 8.6–10.2)
Chloride: 106 mmol/L (ref 98–110)
Creat: 0.78 mg/dL (ref 0.50–1.10)
GFR, Est African American: 107 mL/min/{1.73_m2} (ref >=60)
GFR, Est Non African American: 92 mL/min/{1.73_m2} (ref >=60)
Globulin: 2.4 g/dL (calc) (ref 1.9–3.7)
Glucose, Bld: 114 mg/dL — ABNORMAL HIGH (ref 65–99)
Potassium: 4.5 mmol/L (ref 3.5–5.3)
Sodium: 140 mmol/L (ref 135–146)
Total Bilirubin: 0.6 mg/dL (ref 0.2–1.2)
Total Protein: 6.7 g/dL (ref 6.1–8.1)

## 2019-12-29 LAB — LIPID PANEL W/REFLEX DIRECT LDL
Cholesterol: 189 mg/dL (ref <200)
HDL: 41 mg/dL — ABNORMAL LOW (ref >=50)
LDL Cholesterol (Calc): 133 mg/dL (calc) — ABNORMAL HIGH
Non-HDL Cholesterol (Calc): 148 mg/dL (calc) — ABNORMAL HIGH (ref <130)
Total CHOL/HDL Ratio: 4.6 (calc) (ref <5.0)
Triglycerides: 61 mg/dL (ref <150)

## 2019-12-29 LAB — CBC WITH DIFFERENTIAL/PLATELET
Absolute Monocytes: 547 cells/uL (ref 200–950)
Basophils Absolute: 29 cells/uL (ref 0–200)
Basophils Relative: 0.4 %
Eosinophils Absolute: 108 cells/uL (ref 15–500)
Eosinophils Relative: 1.5 %
HCT: 46.1 % — ABNORMAL HIGH (ref 35.0–45.0)
Hemoglobin: 15.6 g/dL — ABNORMAL HIGH (ref 11.7–15.5)
Lymphs Abs: 1620 cells/uL (ref 850–3900)
MCH: 31.1 pg (ref 27.0–33.0)
MCHC: 33.8 g/dL (ref 32.0–36.0)
MCV: 91.8 fL (ref 80.0–100.0)
MPV: 11.7 fL (ref 7.5–12.5)
Monocytes Relative: 7.6 %
Neutro Abs: 4896 cells/uL (ref 1500–7800)
Neutrophils Relative %: 68 %
Platelets: 264 10*3/uL (ref 140–400)
RBC: 5.02 10*6/uL (ref 3.80–5.10)
RDW: 12.1 % (ref 11.0–15.0)
Total Lymphocyte: 22.5 %
WBC: 7.2 10*3/uL (ref 3.8–10.8)

## 2019-12-29 LAB — HEMOGLOBIN A1C
Hgb A1c MFr Bld: 5.2 % of total Hgb (ref <5.7)
Mean Plasma Glucose: 103 (calc)
eAG (mmol/L): 5.7 (calc)

## 2019-12-29 LAB — TSH: TSH: 2.16 mIU/L

## 2019-12-31 NOTE — Telephone Encounter (Signed)
Patricia Robertson,   Your thyroid is in normal range and right there in the middle!

## 2020-01-25 ENCOUNTER — Other Ambulatory Visit: Payer: Self-pay | Admitting: Physician Assistant

## 2020-01-28 NOTE — Progress Notes (Signed)
Hemoglobin still elevated but size of hemoglobin normal. RBC are a little elevated in number too. Will add iron panel and ferritin and retic count and path smear.

## 2020-01-28 NOTE — Progress Notes (Signed)
Not enough specimen to draw the other labs. Would have to have patient come in.

## 2020-01-29 ENCOUNTER — Encounter: Payer: Self-pay | Admitting: Physician Assistant

## 2020-01-29 DIAGNOSIS — D582 Other hemoglobinopathies: Secondary | ICD-10-CM

## 2020-01-29 DIAGNOSIS — R718 Other abnormality of red blood cells: Secondary | ICD-10-CM

## 2020-01-29 LAB — CBC WITH DIFFERENTIAL/PLATELET
Absolute Monocytes: 630 cells/uL (ref 200–950)
Basophils Absolute: 23 cells/uL (ref 0–200)
Basophils Relative: 0.3 %
Eosinophils Absolute: 83 cells/uL (ref 15–500)
Eosinophils Relative: 1.1 %
HCT: 46.3 % — ABNORMAL HIGH (ref 35.0–45.0)
Hemoglobin: 15.5 g/dL (ref 11.7–15.5)
Lymphs Abs: 1568 cells/uL (ref 850–3900)
MCH: 30.2 pg (ref 27.0–33.0)
MCHC: 33.5 g/dL (ref 32.0–36.0)
MCV: 90.1 fL (ref 80.0–100.0)
MPV: 11.6 fL (ref 7.5–12.5)
Monocytes Relative: 8.4 %
Neutro Abs: 5198 cells/uL (ref 1500–7800)
Neutrophils Relative %: 69.3 %
Platelets: 273 10*3/uL (ref 140–400)
RBC: 5.14 10*6/uL — ABNORMAL HIGH (ref 3.80–5.10)
RDW: 12.1 % (ref 11.0–15.0)
Total Lymphocyte: 20.9 %
WBC: 7.5 10*3/uL (ref 3.8–10.8)

## 2020-01-29 LAB — RETICULOCYTES

## 2020-01-29 LAB — IRON,TIBC AND FERRITIN PANEL

## 2020-01-29 LAB — PATHOLOGIST SMEAR REVIEW

## 2020-02-04 LAB — CBC WITH DIFFERENTIAL/PLATELET
Absolute Monocytes: 585 cells/uL (ref 200–950)
Basophils Absolute: 32 cells/uL (ref 0–200)
Basophils Relative: 0.4 %
Eosinophils Absolute: 71 cells/uL (ref 15–500)
Eosinophils Relative: 0.9 %
HCT: 46.2 % — ABNORMAL HIGH (ref 35.0–45.0)
Hemoglobin: 15.7 g/dL — ABNORMAL HIGH (ref 11.7–15.5)
Lymphs Abs: 1714 cells/uL (ref 850–3900)
MCH: 31 pg (ref 27.0–33.0)
MCHC: 34 g/dL (ref 32.0–36.0)
MCV: 91.1 fL (ref 80.0–100.0)
MPV: 12.5 fL (ref 7.5–12.5)
Monocytes Relative: 7.4 %
Neutro Abs: 5498 cells/uL (ref 1500–7800)
Neutrophils Relative %: 69.6 %
Platelets: 208 10*3/uL (ref 140–400)
RBC: 5.07 10*6/uL (ref 3.80–5.10)
RDW: 12.2 % (ref 11.0–15.0)
Total Lymphocyte: 21.7 %
WBC: 7.9 10*3/uL (ref 3.8–10.8)

## 2020-02-04 LAB — IRON,TIBC AND FERRITIN PANEL
%SAT: 29 % (calc) (ref 16–45)
Ferritin: 80 ng/mL (ref 16–232)
Iron: 94 ug/dL (ref 40–190)
TIBC: 329 mcg/dL (calc) (ref 250–450)

## 2020-02-04 LAB — PATHOLOGIST SMEAR REVIEW

## 2020-02-04 NOTE — Telephone Encounter (Signed)
Yunique,   RBC back down.  Hemoglobin just tad elevated.  Hematocrit just hair elevated.   Iron and iron stores look good.   I would give blood every 3 months and would like normalize these levels.

## 2020-02-05 NOTE — Telephone Encounter (Signed)
No concerns on path smear.

## 2020-02-14 ENCOUNTER — Other Ambulatory Visit: Payer: Self-pay | Admitting: Obstetrics & Gynecology

## 2020-02-14 MED ORDER — MEDROXYPROGESTERONE ACETATE 10 MG PO TABS: 10.0000 mg | ORAL_TABLET | Freq: Every day | ORAL | 0 refills | Status: DC

## 2020-02-14 NOTE — Progress Notes (Signed)
Pt still bleeding with Mirena.  Will try provera 10 days.  Rx sent.

## 2020-02-22 ENCOUNTER — Encounter: Payer: Self-pay | Admitting: Physician Assistant

## 2020-02-29 ENCOUNTER — Encounter: Payer: Self-pay | Admitting: Physician Assistant

## 2020-03-12 ENCOUNTER — Telehealth (INDEPENDENT_AMBULATORY_CARE_PROVIDER_SITE_OTHER): Payer: BC Managed Care – PPO | Admitting: Physician Assistant

## 2020-03-12 ENCOUNTER — Encounter: Payer: Self-pay | Admitting: Physician Assistant

## 2020-03-12 VITALS — Ht 62.5 in | Wt 216.0 lb

## 2020-03-12 DIAGNOSIS — M25562 Pain in left knee: Secondary | ICD-10-CM | POA: Diagnosis not present

## 2020-03-12 DIAGNOSIS — D582 Other hemoglobinopathies: Secondary | ICD-10-CM

## 2020-03-12 DIAGNOSIS — R718 Other abnormality of red blood cells: Secondary | ICD-10-CM

## 2020-03-12 DIAGNOSIS — L299 Pruritus, unspecified: Secondary | ICD-10-CM

## 2020-03-12 NOTE — Patient Instructions (Signed)
Polycythemia Vera  Polycythemia vera (PV), or myeloproliferative disease, is a form of blood cancer in which the bone marrow makes too many (overproduces) red blood cells. The bone marrow may also make too many clotting cells (platelets) and white blood cells. Bone marrow is the spongy center of bones where blood cells are produced. Sometimes, there may be an overproduction of blood cells in the liver and spleen, causing those organs to become enlarged. Additionally, people who have PV are at a higher risk for stroke or heart attack because their blood may clot more easily. PV is a long-term (chronic)disease. What are the causes? Almost all people who have PV have an abnormal gene (genetic mutation) that causes changes in the way that the bone marrow makes blood cells. This gene, which is called JAK2, is not passed along from parent to child (is not hereditary). It is not known what triggers the genetic mutation that causes the body to produce too many red blood cells. What increases the risk? You are more likely to develop this condition if you are:  Female.  44 years of age or older. What are the signs or symptoms? You may not have any symptoms in the early stage of PV. When symptoms develop, they may include:  Shortness of breath.  Dizziness.  Hot and flushed skin.  Itchy skin.  Sweats, especially night sweats.  Headache.  Tiredness.  Ringing in the ears.  Blurred vision or blind spots.  Bone pain.  Weight loss.  Fever.  Blood-tinged vomit or stool. How is this diagnosed? This condition may be diagnosed during a routine physical exam and a blood test called a complete blood count (CBC). Your health care provider also may suspect PV if you have symptoms. During the physical exam, your provider may find that you have an enlarged liver or spleen. You may also have tests to confirm the diagnosis. These may include:  A procedure to remove a sample of bone marrow for testing  (bone marrow biopsy).  Blood tests to check for: ? The JAK2 gene. ? Low levels of a hormone that helps to regulate blood production (erythropoietin). How is this treated? There is no cure for PV, but treatment can help to control the disease. There are several types of treatment. No single treatment works for everyone. You will need to work with a blood cancer specialist (hematologist) to find the treatment that is best for you. This condition may be treated by:  Periodically having some blood removed with a needle (drawn) to lower the number of red blood cells (phlebotomy).  Taking medicine. Your health care provider may recommend: ? Low-dose aspirin to lower your risk for blood clots. ? A medicine to reduce red blood cell production. ? A medicine to lower the number of red blood cells. ? A medicine that slows down the effects of JAK2 gene. ? Other medicines to treat symptoms such as itching. Follow these instructions at home:   Take over-the-counter and prescription medicines only as told by your health care provider.  Return to your normal activities as told by your health care provider. Ask your health care provider what activities are safe for you.  Do regular exercise as told by your health care provider.  Check your hands and feet regularly for any sores that do not heal.  Do not use any products that contain nicotine or tobacco, such as cigarettes, e-cigarettes, and chewing tobacco. If you need help quitting, ask your health care provider.  Keep all  follow-up visits as told by your health care provider. This is important. Contact a health care provider if:  You have side effects from your medicines.  Your symptoms change or get worse at home.  You have blood in your stool or you vomit blood. Get help right away if:  You have sudden and severe pain in your abdomen.  You have chest pain or difficulty breathing.  You have signs of stroke, such as: ? Sudden  numbness. ? Weakness of your face or arm. ? Confusion. ? Difficulty speaking or understanding speech. These symptoms may represent a serious problem that is an emergency. Do not wait to see if the symptoms will go away. Get medical help right away. Call your local emergency services (911 in the [place] Do not drive yourself to the hospital. Summary  Polycythemia vera is a form of blood cancer in which the bone marrow makes too many red blood cells.  People who have polycythemia vera are at a higher risk for stroke or heart attack because their blood may clot more easily.  The disease is caused by a genetic mutation that causes changes in the way that the bone marrow makes blood cells.  It is diagnosed with blood tests and a bone marrow biopsy.  There is no cure for PV, but treatment can help to control the disease. It is treated by periodically having some blood removed and by taking medicines. This information is not intended to replace advice given to you by your health care provider. Make sure you discuss any questions you have with your health care provider. Document Revised: 05/11/2018 Document Reviewed: 05/11/2018 Elsevier Patient Education  2020 [employer].

## 2020-03-12 NOTE — Progress Notes (Signed)
Itching skin lately - between and under breasts and in vaginal area - has used metronidazole powder which helps some but comes right back  Issues with left knee - has had two knee surgeries in the past, in the past has had issues with top of knee but now having issues with tendon on the back of the knee, using a lot of ibuprofen and still having pain  Patient wants to discuss recent lab results, she has stopped multivitamin with iron but doesn't know if she should continue off of it:  "RBC back down.  Hemoglobin just tad elevated.  Hematocrit just hair elevated.   Iron and iron stores look good.   I would give blood every 3 months and would like normalize these levels".

## 2020-03-18 ENCOUNTER — Encounter: Payer: Self-pay | Admitting: Physician Assistant

## 2020-03-18 DIAGNOSIS — G8929 Other chronic pain: Secondary | ICD-10-CM | POA: Insufficient documentation

## 2020-03-18 DIAGNOSIS — M25562 Pain in left knee: Secondary | ICD-10-CM | POA: Insufficient documentation

## 2020-03-18 DIAGNOSIS — R718 Other abnormality of red blood cells: Secondary | ICD-10-CM | POA: Insufficient documentation

## 2020-03-18 DIAGNOSIS — L299 Pruritus, unspecified: Secondary | ICD-10-CM | POA: Insufficient documentation

## 2020-03-18 DIAGNOSIS — D582 Other hemoglobinopathies: Secondary | ICD-10-CM | POA: Insufficient documentation

## 2020-03-18 NOTE — Progress Notes (Signed)
Patient ID: Patricia Robertson, female   DOB: 03/12/1976, 44 y.o.   MRN: [id number] .Marland KitchenVirtual Visit via Video Note  I connected with Patricia Robertson on 03/12/2020 at  9:50 AM EDT by a video enabled telemedicine application and verified that I am speaking with the correct person using two identifiers.  Location: Patient: home Provider: clinic   I discussed the limitations of evaluation and management by telemedicine and the availability of in person appointments. The patient expressed understanding and agreed to proceed.  History of Present Illness: Pt is a 43 yo female who presents to the clinic to discuss a few problems concerns.   Pt is having some itchy skin without rash. More focused under breast and in vaginal area. She uses miconazole powder and helps and then comes back. No new medications. No new soaps or detergents.   She is also have left lateral posterior knee pain. Hx of 2 knee surgeries. There will be times where exteremly painful and then resolve. Ibuprofen does help but she has been using more and more of this. No known new injury.   Patient wants to discuss recent lab results, she has stopped multivitamin with iron but doesn't know if she should continue off of it:  "RBC back down.  Hemoglobin just tad elevated.  Hematocrit just hair elevated.   Iron and iron stores look good.    .. 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   Class 2 obesity due to excess calories without serious comorbidity with body mass index (BMI) of 35.0 to 35.9 in adult 05/28/2019   Anxiety about health 05/28/2019   Stress reaction 05/28/2019   Elevated hematocrit 03/18/2020   Elevated hemoglobin (HCC) 03/18/2020   Acute pain of left knee 03/18/2020   Itching 03/18/2020   Resolved Ambulatory Problems    Diagnosis  Date Noted   Left tibial fracture 11/06/2012   Fracture of radial head, left, closed 03/04/2014   Past Medical History:  Diagnosis Date   Anxiety    Elbow fracture    Knee fracture    Obesity    Reviewed med, allergy, problem list.   Observations/Objective: No acute distress No rash seen. Normal breathing. Normal mood and appearance.  No abnormality noted over left knee in video.   .. Today's Vitals   03/12/20 0924  Weight: 216 lb (98 kg)  Height: 5' 2.5" (1.588 m)   Body mass index is 38.88 kg/m.    Assessment and Plan: Marland KitchenMarland KitchenNicolle was seen today for follow-up.  Diagnoses and all orders for this visit:  Itching  Acute pain of left knee  Elevated hemoglobin (HCC)  Elevated hematocrit   Unclear etiology of itching. I am wondering if could be coming from elevated hemogloblin and hematocrit. No rash. I would start with taking claritin/zyrtec daily and then report back in 2 week if itching is better. Follow up with any new symptoms.   Pt does not smoke but admits to drinking some wine nightly. I wonder if this could be cause the small elevation in hemogloblin and hematocrit. Pt should stop alcohol and recheck in 4-6 weeks.   I think patient needs to strengthen hamstring for knee pain. Where her pain is around that connection. Work on hamstring stretches and see if helps. Pt is pain free today.    Follow Up Instructions:  I discussed the assessment and treatment plan with the patient. The patient was provided an opportunity to ask questions and all were answered. The patient agreed with the plan and demonstrated an understanding of the instructions.   The patient was advised to call back or seek an in-person evaluation if the symptoms worsen or if the condition fails to improve as anticipated.  I provided 25 minutes of non-face-to-face time during this encounter discussing labs, treatment plans, exercises.    Tandy Gaw, PA-C

## 2020-04-08 ENCOUNTER — Encounter: Payer: Self-pay | Admitting: Physician Assistant

## 2020-04-14 ENCOUNTER — Telehealth: Payer: Self-pay | Admitting: *Deleted

## 2020-04-14 NOTE — Telephone Encounter (Signed)
Left patient a message to call and schedule consult with Dr. Earlene Plater to discuss an ablation. First availability as of this time today is Thursday, May 08, 2020 at 8:15 or 9:45 AM, subject to change as patient's call to schedule appointments.

## 2020-05-08 ENCOUNTER — Encounter: Payer: Self-pay | Admitting: Obstetrics and Gynecology

## 2020-05-08 ENCOUNTER — Other Ambulatory Visit: Payer: Self-pay

## 2020-05-08 ENCOUNTER — Telehealth (INDEPENDENT_AMBULATORY_CARE_PROVIDER_SITE_OTHER): Payer: BC Managed Care – PPO | Admitting: Obstetrics and Gynecology

## 2020-05-08 DIAGNOSIS — Z30431 Encounter for routine checking of intrauterine contraceptive device: Secondary | ICD-10-CM | POA: Diagnosis not present

## 2020-05-08 DIAGNOSIS — N939 Abnormal uterine and vaginal bleeding, unspecified: Secondary | ICD-10-CM

## 2020-05-08 NOTE — Progress Notes (Signed)
GYNECOLOGY VIRTUAL VISIT ENCOUNTER NOTE  Provider location: [facility] [place]   I connected with Patricia Robertson on 05/08/20 at  9:45 AM EDT by MyChart Video Encounter at home and verified that I am speaking with the correct person using two identifiers.   I discussed the limitations, risks, security and privacy concerns of performing an evaluation and management service virtually and the availability of in person appointments. I also discussed with the patient that there may be a patient responsible charge related to this service. The patient expressed understanding and agreed to proceed.   History:  Patricia Robertson is a 44 y.o. G17P1001 female being evaluated today for irregular bleeding. She had Mirena IUD removed and replaced in 10/2019. She had a week of bleeding after IUD put in. Then had 7-8 days of bleeding monthly after that. She is not bleeding heavily but is annoyed that she is having to bleed. She took provera for the bleeding which stopped her period, but then the month after that, she bled (spotting) for 3-4 weeks. She is currently not bleeding x 3-4 days. She would like to stop bleeding and while she is not bleeding heavily, she is annoyed with the irregularity of the current bleeding. She had no bleeding with her prior Mirena.   She denies any abnormal vaginal discharge, bleeding, pelvic pain or other concerns.       Past Medical History:  Diagnosis Date  . Anxiety   . Elbow fracture   . Knee fracture   . Obesity    Past Surgical History:  Procedure Laterality Date  . EYE SURGERY    . tibial plateau repair     The following portions of the patient's history were reviewed and updated as appropriate: allergies, current medications, past family history, past medical history, past social history, past surgical history and problem list.   Health Maintenance:  Normal pap and negative HRHPV on 10/2019.  Normal mammogram on 12/2019.   Review of  Systems:  Pertinent items noted in HPI and remainder of comprehensive ROS otherwise negative.  Physical Exam:   General:  Alert, oriented and cooperative. Patient appears to be in no acute distress.  Mental Status: Normal mood and affect. Normal behavior. Normal judgment and thought content.   Respiratory: Normal respiratory effort, no problems with respiration noted  Rest of physical exam deferred due to type of encounter  Labs and Imaging No results found for this or any previous visit (from the past 336 hour(s)). No results found.     Assessment and Plan:   1. Abnormal uterine bleeding (AUB) - Reassured patient that some irregular bleeding is to be expected with change in IUD - will try another progesterone if she starts having bleeding again - would expect her to settle into predictable bleeding pattern or no bleeding soon - if no improvement in irregular bleeding, will get [place] to check placement of IUD and discuss other options for controlling bleeding, she is agreeable to this plan  2. IUD check up See above   I discussed the assessment and treatment plan with the patient. The patient was provided an opportunity to ask questions and all were answered. The patient agreed with the plan and demonstrated an understanding of the instructions.   The patient was advised to call back or seek an in-person evaluation/go to the ED if the symptoms worsen or if the condition fails to improve as anticipated.  I provided 18 minutes of face-to-face time  during this encounter.   Conan Bowens, MD [facility] for [facility], [facility]

## 2020-05-13 ENCOUNTER — Encounter: Payer: Self-pay | Admitting: Physician Assistant

## 2020-05-17 ENCOUNTER — Encounter: Payer: Self-pay | Admitting: Physician Assistant

## 2020-05-22 ENCOUNTER — Telehealth: Payer: Self-pay | Admitting: *Deleted

## 2020-05-22 DIAGNOSIS — R718 Other abnormality of red blood cells: Secondary | ICD-10-CM

## 2020-05-22 DIAGNOSIS — D582 Other hemoglobinopathies: Secondary | ICD-10-CM

## 2020-05-22 NOTE — Telephone Encounter (Signed)
Labs ordered to recheck iron panels.

## 2020-05-23 LAB — CBC WITH DIFFERENTIAL/PLATELET
Absolute Monocytes: 676 cells/uL (ref 200–950)
Basophils Absolute: 29 cells/uL (ref 0–200)
Basophils Relative: 0.3 %
Eosinophils Absolute: 147 cells/uL (ref 15–500)
Eosinophils Relative: 1.5 %
HCT: 43.5 % (ref 35.0–45.0)
Hemoglobin: 14.6 g/dL (ref 11.7–15.5)
Lymphs Abs: 2342 cells/uL (ref 850–3900)
MCH: 30.4 pg (ref 27.0–33.0)
MCHC: 33.6 g/dL (ref 32.0–36.0)
MCV: 90.4 fL (ref 80.0–100.0)
MPV: 11.4 fL (ref 7.5–12.5)
Monocytes Relative: 6.9 %
Neutro Abs: 6605 cells/uL (ref 1500–7800)
Neutrophils Relative %: 67.4 %
Platelets: 352 10*3/uL (ref 140–400)
RBC: 4.81 10*6/uL (ref 3.80–5.10)
RDW: 11.9 % (ref 11.0–15.0)
Total Lymphocyte: 23.9 %
WBC: 9.8 10*3/uL (ref 3.8–10.8)

## 2020-05-23 LAB — IRON,TIBC AND FERRITIN PANEL
%SAT: 18 % (calc) (ref 16–45)
Ferritin: 65 ng/mL (ref 16–232)
Iron: 65 ug/dL (ref 40–190)
TIBC: 359 mcg/dL (calc) (ref 250–450)

## 2020-05-23 NOTE — Telephone Encounter (Signed)
Patricia Robertson,   Hemoglobin normal range. RBC production and size look good too. Serum iron dropped a hair but in normal range. Iron stores above 40 and good.

## 2020-05-30 ENCOUNTER — Other Ambulatory Visit: Payer: Self-pay

## 2020-05-30 ENCOUNTER — Ambulatory Visit (INDEPENDENT_AMBULATORY_CARE_PROVIDER_SITE_OTHER): Payer: BC Managed Care – PPO | Admitting: Physician Assistant

## 2020-05-30 ENCOUNTER — Encounter: Payer: Self-pay | Admitting: Physician Assistant

## 2020-05-30 ENCOUNTER — Ambulatory Visit (INDEPENDENT_AMBULATORY_CARE_PROVIDER_SITE_OTHER): Payer: BC Managed Care – PPO

## 2020-05-30 VITALS — BP 112/69 | HR 73 | Ht 62.5 in | Wt 227.0 lb

## 2020-05-30 DIAGNOSIS — E559 Vitamin D deficiency, unspecified: Secondary | ICD-10-CM | POA: Diagnosis not present

## 2020-05-30 DIAGNOSIS — R5383 Other fatigue: Secondary | ICD-10-CM

## 2020-05-30 DIAGNOSIS — G478 Other sleep disorders: Secondary | ICD-10-CM

## 2020-05-30 DIAGNOSIS — M25562 Pain in left knee: Secondary | ICD-10-CM | POA: Diagnosis not present

## 2020-05-30 DIAGNOSIS — R0683 Snoring: Secondary | ICD-10-CM

## 2020-05-30 DIAGNOSIS — E538 Deficiency of other specified B group vitamins: Secondary | ICD-10-CM

## 2020-05-30 DIAGNOSIS — G8929 Other chronic pain: Secondary | ICD-10-CM

## 2020-05-30 DIAGNOSIS — R41840 Attention and concentration deficit: Secondary | ICD-10-CM

## 2020-05-30 MED ORDER — BUPROPION HCL ER (SR) 100 MG PO TB12: 100.0000 mg | ORAL_TABLET | Freq: Two times a day (BID) | ORAL | 1 refills | Status: DC

## 2020-05-30 NOTE — Patient Instructions (Signed)

## 2020-05-30 NOTE — Progress Notes (Signed)
Subjective:    Patient ID: Patricia Robertson, female    DOB: April 03, 1976, 44 y.o.   MRN: [id number]  HPI  Pt is a 44 yo female with hx of migraines and anxiety about health who presents to the clinic with multiple concerns today.   Ever since she got her covid booster she has been much more fatigued. She has gained weight. She is snoring and wakes up not feeling rested. She feels like her mood is good. Denies any SI/HC. She wants to lose weight. She denies any SOB or wheezing. She denies any swelling. Having some hot flashes. She does not have a cycle due to mirena.   She continues to have left knee pain. She has been doing some knee exercises that make worse. She has hx of fracture of tibia back in 2011. The pain in her knee is right at the tibia. At times it is severe and almost locks up her leg. Ibuprofen helps some.   She wonders about her skin tags and where they are from.   .. Active Ambulatory Problems    Diagnosis Date Noted  . Anxiety and depression 11/06/2012  . Migraine headache 05/25/2013  . Neck muscle spasm 05/25/2013  . 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 Morbid obesity (HCC) 05/28/2019  . Anxiety about health 05/28/2019  . Stress reaction 05/28/2019  . Elevated hematocrit 03/18/2020  . Elevated hemoglobin (HCC) 03/18/2020  . Chronic pain of left knee 03/18/2020  . Itching 03/18/2020  . Fatigue 06/06/2020  . Non-restorative sleep 06/06/2020  . Poor concentration 06/06/2020  . Snoring 06/06/2020  . Vitamin D deficiency 06/06/2020   Resolved Ambulatory Problems    Diagnosis Date Noted  . Left tibial fracture 11/06/2012  . Fracture of radial head, left, closed 03/04/2014   Past Medical History:  Diagnosis Date  . Anxiety   . Elbow fracture   . Knee fracture   . Obesity      Review of Systems See HPI.     Objective:   Physical Exam Vitals reviewed.  Constitutional:      Appearance: Normal appearance. She is obese.   Cardiovascular:     Rate and Rhythm: Normal rate and regular rhythm.     Pulses: Normal pulses.  Pulmonary:     Effort: Pulmonary effort is normal.     Breath sounds: Normal breath sounds.  Skin:    Comments: Skin tags, multiple, around neck.   Neurological:     General: No focal deficit present.     Mental Status: She is alert and oriented to person, place, and time.  Psychiatric:        Mood and Affect: Mood normal.          .. Depression screen Patricia Robertson 2/9 05/30/2020 05/28/2019 02/08/2019  Decreased Interest 1 2 0  Down, Depressed, Hopeless 0 2 1  PHQ - 2 Score 1 4 1   Altered sleeping 1 2 0  Tired, decreased energy 1 2 0  Change in appetite 1 0 1  Feeling bad or failure about yourself  0 0 1  Trouble concentrating 1 1 1   Moving slowly or fidgety/restless 0 0 0  Suicidal thoughts 0 0 0  PHQ-9 Score 5 9 4   Difficult doing work/chores Very difficult Somewhat difficult Somewhat difficult   . GAD 7 : Generalized Anxiety Score 05/30/2020 05/28/2019 02/08/2019  Nervous, Anxious, on Edge 1 3 1   Control/stop worrying 1 3 1   Worry too much - different things  1 3 1   Trouble relaxing 0 3 1  Restless 0 3 0  Easily annoyed or irritable 0 3 1  Afraid - awful might happen 0 3 2  Total GAD 7 Score 3 21 7   Anxiety Difficulty Somewhat difficult Very difficult Somewhat difficult     Assessment & Plan:   Patricia Robertson was seen today for abnormal lab.  Diagnoses and all orders for this visit:  Fatigue, unspecified type -     VITAMIN D 25 Hydroxy (Vit-D Deficiency, Fractures) -     B12 and Folate Panel -     Thyroid Panel With TSH -     B. burgdorfi antibodies -     Thyroid Peroxidase Antibodies (TPO) (REFL) -     DG Knee 4 Views W/Patella Left -     C-reactive protein -     Home sleep test  Vitamin D deficiency -     VITAMIN D 25 Hydroxy (Vit-D Deficiency, Fractures)  B12 deficiency -     B12 and Folate Panel  Chronic pain of left knee -     DG Knee 4 Views W/Patella  Left  Snoring -     Home sleep test  Non-restorative sleep -     Home sleep test  Morbid obesity (HCC) -     buPROPion (WELLBUTRIN SR) 100 MG 12 hr tablet; Take 1 tablet (100 mg total) by mouth 2 (two) times daily. -     Home sleep test  Poor concentration -     buPROPion (WELLBUTRIN SR) 100 MG 12 hr tablet; Take 1 tablet (100 mg total) by mouth 2 (two) times daily. -     Ambulatory referral to Psychology  Fatigue/obese/snoring: Get labs.  Get sleep study.   Marland Kitchen.Discussed low carb diet with 1500 calories and 80g of protein.  Exercising at least 150 minutes a week.  My Fitness Pal could be a Patricia Robertson.  Discussed IF 16 to 8.  Start wellbutrin bid. Discussed side effects.  Follow up in 3 months.   Could we some ADD. Will get testing. wellbutrin could also help.  Unsure why hurting so bad.no recent injury. Will gt xray. Suggest sports medicine referral.  No acute changes to xray.   NSAID as needed.  Continue compression brace.   Reassurance given about skin tags and causes. She can have removed ok to schedule appt.

## 2020-05-30 NOTE — Progress Notes (Signed)
Wants to discuss: Extreme fatigue Possibility of thyroid issue Recent lab results Itching skin (took claritin which helped but didn't want to keep taking?) Getting lots of skin tags around neck Needs to move records in January to: Massie Kluver, MD with [facility]

## 2020-06-02 ENCOUNTER — Encounter: Payer: Self-pay | Admitting: Physician Assistant

## 2020-06-02 DIAGNOSIS — G8929 Other chronic pain: Secondary | ICD-10-CM

## 2020-06-02 DIAGNOSIS — M25562 Pain in left knee: Secondary | ICD-10-CM

## 2020-06-02 LAB — THYROID PANEL WITH TSH
Free Thyroxine Index: 2.2 (ref 1.4–3.8)
T3 Uptake: 31 % (ref 22–35)
T4, Total: 7.1 ug/dL (ref 5.1–11.9)
TSH: 2.48 mIU/L

## 2020-06-02 LAB — THYROID PEROXIDASE ANTIBODIES (TPO) (REFL): Thyroperoxidase Ab SerPl-aCnc: 1 IU/mL (ref <9)

## 2020-06-02 LAB — C-REACTIVE PROTEIN: CRP: 5.5 mg/L (ref <8.0)

## 2020-06-02 LAB — B12 AND FOLATE PANEL
Folate: >24 ng/mL
Vitamin B-12: 483 pg/mL (ref 200–1100)

## 2020-06-02 LAB — VITAMIN D 25 HYDROXY (VIT D DEFICIENCY, FRACTURES): Vit D, 25-Hydroxy: 38 ng/mL (ref 30–100)

## 2020-06-02 LAB — B. BURGDORFI ANTIBODIES: B burgdorferi Ab IgG+IgM: <0.9 index

## 2020-06-02 NOTE — Progress Notes (Signed)
Patricia Robertson, no acute findings. No significant arthritis or joint space deteration. This could be more tendon/cartilage related. Thoughts on a few physical therapy sessions to see if any improvement or could consider seeing sports medicine provider?

## 2020-06-02 NOTE — Progress Notes (Signed)
Jaidin,   Vitamin D is normal range but on the low side of normal. I do think goal of greater than 50 would be optimal. Taking the 1,000 units daily right? You could increase to 2000 units daily.  B12 and folate great.  Thyroid panel perfect. Waiting for antibody to thyroid to return.  CRP normal.

## 2020-06-02 NOTE — Progress Notes (Signed)
Lymes antibodies negative.

## 2020-06-03 NOTE — Progress Notes (Signed)
No thyroid antibodies.

## 2020-06-06 DIAGNOSIS — R41840 Attention and concentration deficit: Secondary | ICD-10-CM | POA: Insufficient documentation

## 2020-06-06 DIAGNOSIS — R0683 Snoring: Secondary | ICD-10-CM | POA: Insufficient documentation

## 2020-06-06 DIAGNOSIS — E559 Vitamin D deficiency, unspecified: Secondary | ICD-10-CM | POA: Insufficient documentation

## 2020-06-06 DIAGNOSIS — R5383 Other fatigue: Secondary | ICD-10-CM | POA: Insufficient documentation

## 2020-06-06 DIAGNOSIS — G478 Other sleep disorders: Secondary | ICD-10-CM | POA: Insufficient documentation

## 2020-07-04 ENCOUNTER — Other Ambulatory Visit: Payer: Self-pay | Admitting: Physician Assistant

## 2020-07-04 DIAGNOSIS — R41840 Attention and concentration deficit: Secondary | ICD-10-CM

## 2020-10-23 ENCOUNTER — Other Ambulatory Visit: Payer: Self-pay | Admitting: Physician Assistant

## 2020-10-23 DIAGNOSIS — R41840 Attention and concentration deficit: Secondary | ICD-10-CM

## 2021-07-24 LAB — HM MAMMOGRAPHY

## 2022-03-03 IMAGING — MG DIGITAL SCREENING BILAT W/ TOMO W/ CAD
8 series · 8 of 24 positions shown · non-contrast
Comparison: None.

CLINICAL DATA: Screening.

EXAM:
DIGITAL SCREENING BILATERAL MAMMOGRAM WITH TOMO AND CAD

[R MLO synth-2D]
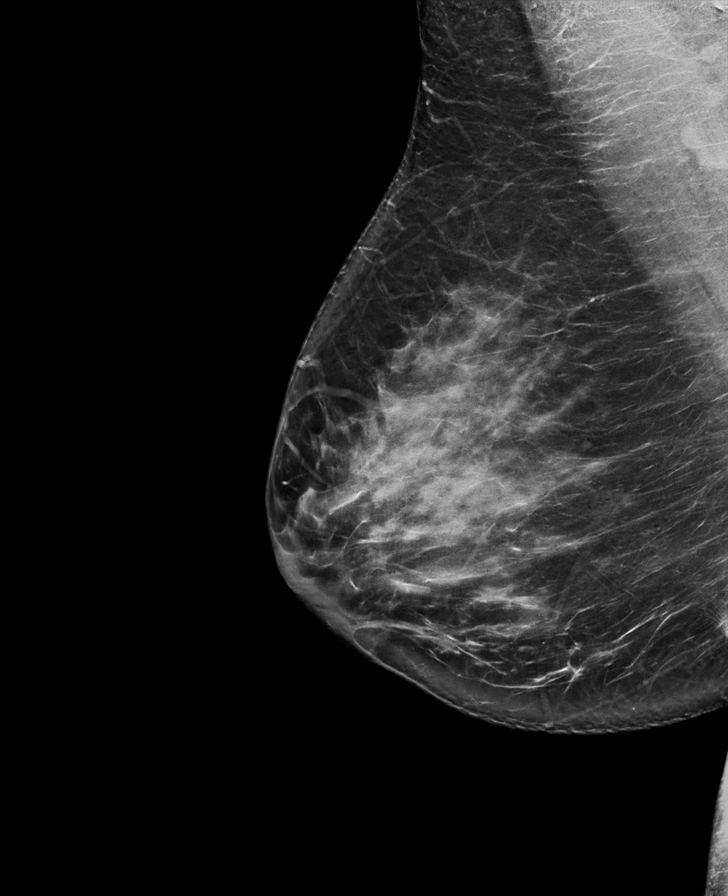

[R CC synth-2D]
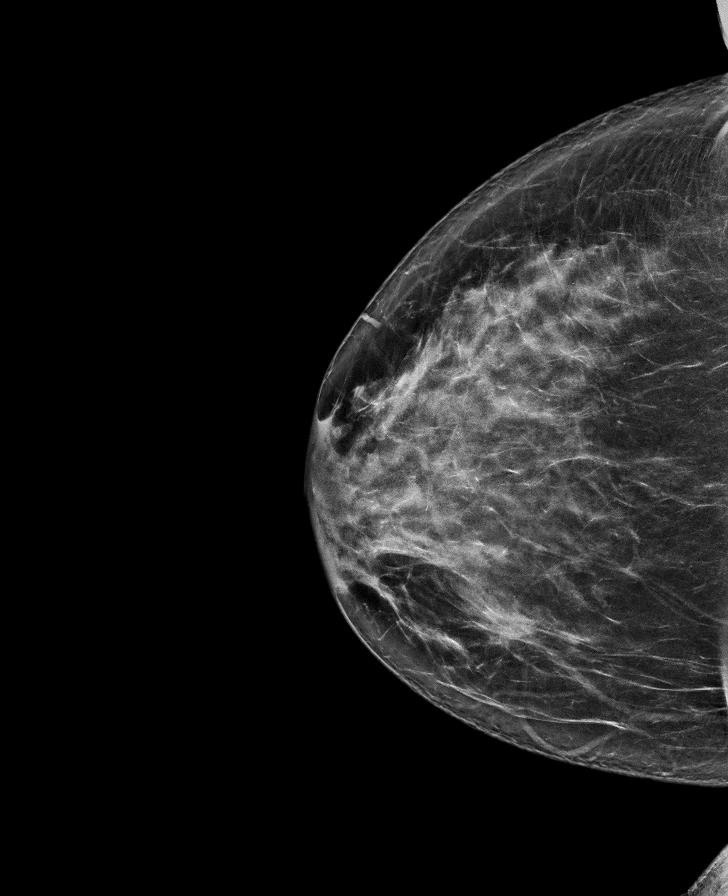

[L MLO synth-2D]
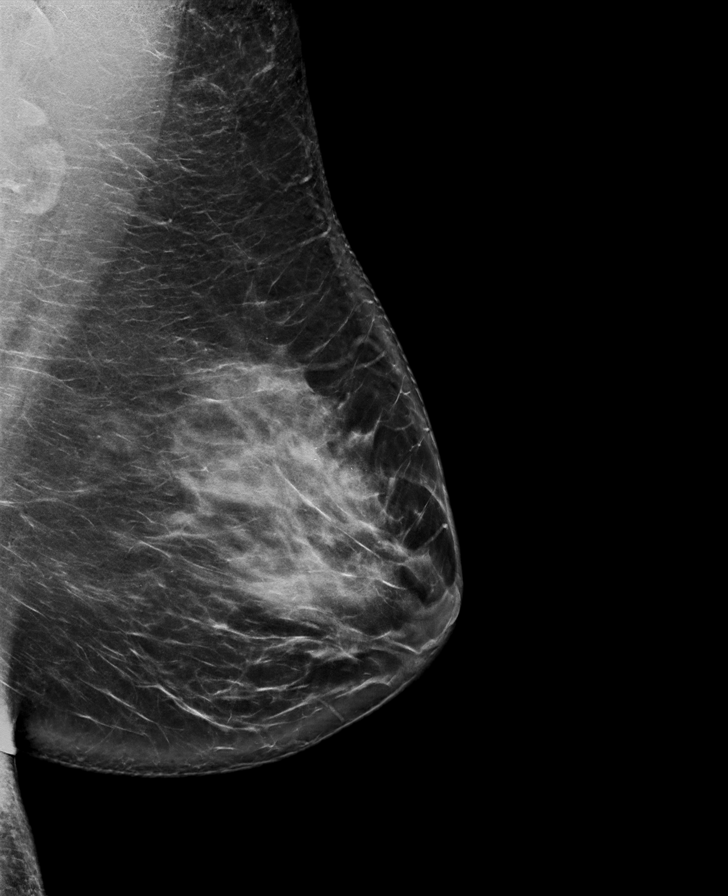

[L CC synth-2D]
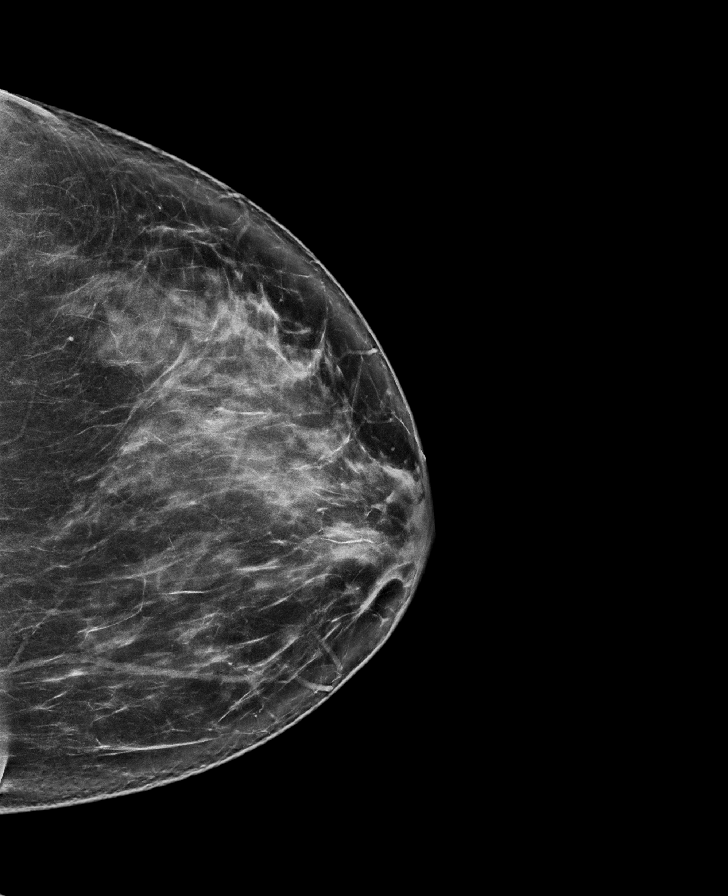

[L MLO tomo · tomo slice 50/99.0]
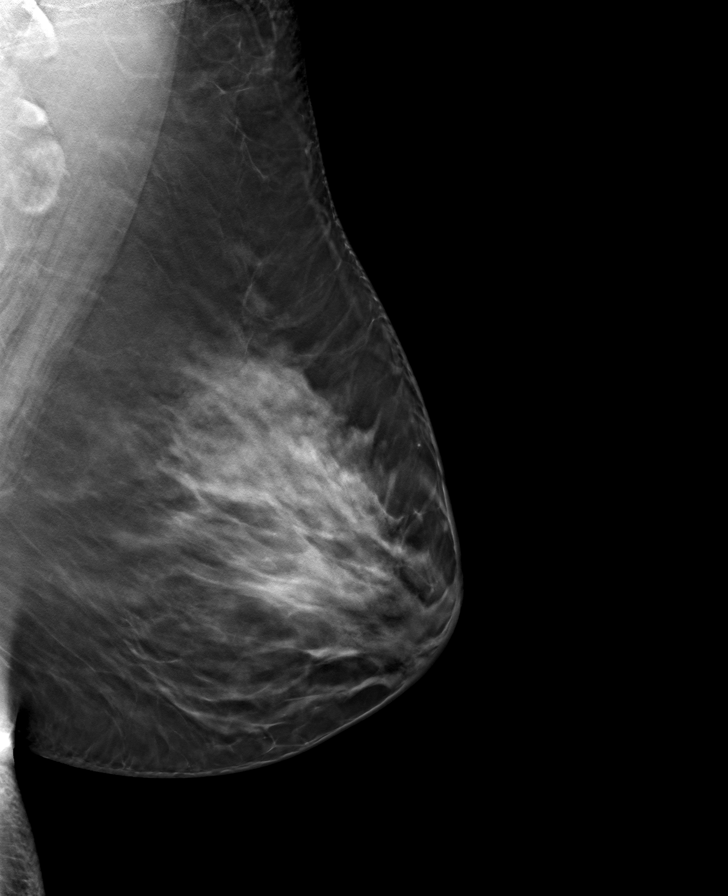

[L CC tomo · tomo slice 43/85.0]
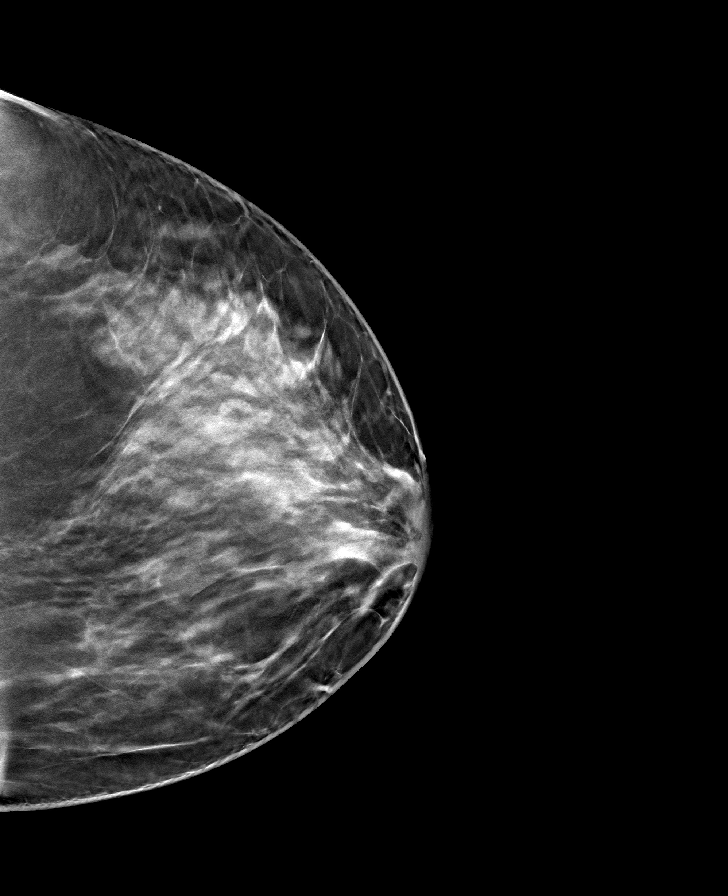

[R MLO tomo · tomo slice 46/91.0]
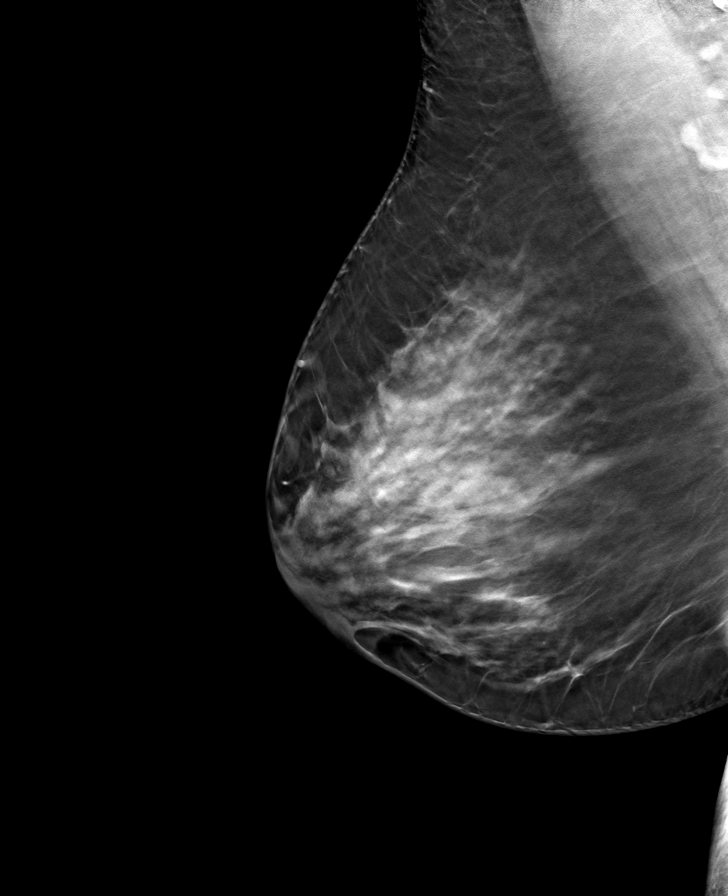

[R CC tomo · tomo slice 41/82.0]
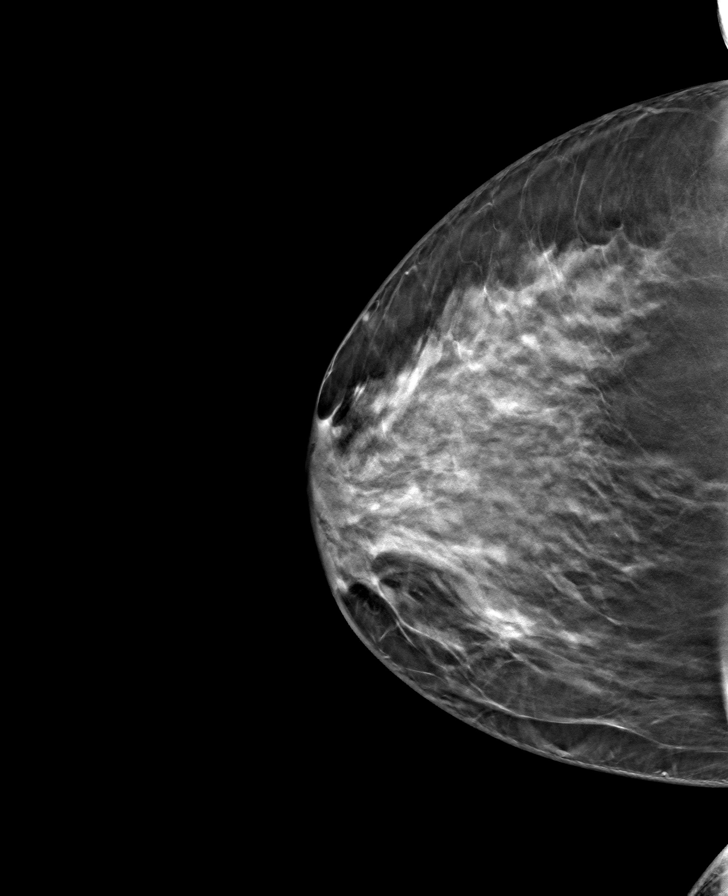

[8 of 24 positions shown; findings below may reference images not displayed]

ACR Breast Density Category c: The breast tissue is heterogeneously
dense, which may obscure small masses.
FINDINGS: In the right breast, a possible asymmetry warrants further
evaluation. In the left breast, no findings suspicious for
malignancy. Images were processed with CAD.
IMPRESSION: Further evaluation is suggested for possible asymmetry in the right
breast.

RECOMMENDATION:
Diagnostic mammogram and possibly ultrasound of the right breast.
(Code:[id number]-X-[id number])

The patient will be contacted regarding the findings, and additional
imaging will be scheduled.

BI-RADS CATEGORY  0: Incomplete. Need additional imaging evaluation
and/or prior mammograms for comparison.
